# Patient Record
Sex: Male | Born: 1995
Health system: Southern US, Community
[De-identification: ages and names within clinical notes are randomized; demographics above are authoritative.]

## PROBLEM LIST (undated history)

## (undated) DIAGNOSIS — F32A Depression, unspecified: Secondary | ICD-10-CM

## (undated) DIAGNOSIS — F419 Anxiety disorder, unspecified: Secondary | ICD-10-CM

## (undated) DIAGNOSIS — F329 Major depressive disorder, single episode, unspecified: Secondary | ICD-10-CM

## (undated) HISTORY — DX: Anxiety disorder, unspecified: F41.9

## (undated) HISTORY — PX: WISDOM TOOTH EXTRACTION: SHX21

## (undated) HISTORY — DX: Major depressive disorder, single episode, unspecified: F32.9

## (undated) HISTORY — DX: Depression, unspecified: F32.A

---
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
## 2016-02-21 DIAGNOSIS — H52223 Regular astigmatism, bilateral: Secondary | ICD-10-CM | POA: Diagnosis not present

## 2016-02-21 DIAGNOSIS — H5213 Myopia, bilateral: Secondary | ICD-10-CM | POA: Diagnosis not present

## 2016-05-16 DIAGNOSIS — F332 Major depressive disorder, recurrent severe without psychotic features: Secondary | ICD-10-CM | POA: Diagnosis not present

## 2016-05-16 MED FILL — ESCITALOPRAM 10 MG TABLET: 10 | 30 days supply | Qty: 30 | Fill #0

## 2016-06-13 DIAGNOSIS — F332 Major depressive disorder, recurrent severe without psychotic features: Secondary | ICD-10-CM | POA: Diagnosis not present

## 2016-06-13 MED FILL — ESCITALOPRAM 20 MG TABLET: 20 | 30 days supply | Qty: 30 | Fill #0

## 2016-07-11 DIAGNOSIS — F332 Major depressive disorder, recurrent severe without psychotic features: Secondary | ICD-10-CM | POA: Diagnosis not present

## 2016-07-15 MED FILL — ESCITALOPRAM 20 MG TABLET: 20 | 30 days supply | Qty: 30 | Fill #0

## 2016-08-18 MED FILL — diazePAM 5 MG TABS: 5 | 1 days supply | Qty: 1 | Fill #0

## 2016-08-18 MED FILL — OXYCODONE/APAP 5/325 MG TAB: 5-325 | 5 days supply | Qty: 24 | Fill #0

## 2016-08-18 MED FILL — AMOXICILLIN 500 MG CAPSULE: 500 | 7 days supply | Qty: 21 | Fill #0

## 2016-08-18 MED FILL — METHYLPREDNISOLONE 4 MG TAB: 4 | 6 days supply | Qty: 21 | Fill #0

## 2016-08-18 MED FILL — IBUPROFEN 800 MG TABLET: 800 | 7 days supply | Qty: 21 | Fill #0

## 2016-08-18 MED FILL — ESCITALOPRAM 20 MG TABLET: 20 | 30 days supply | Qty: 30 | Fill #1

## 2016-09-15 MED FILL — ESCITALOPRAM 20 MG TABLET: 20 | 30 days supply | Qty: 30 | Fill #2

## 2016-10-03 DIAGNOSIS — F332 Major depressive disorder, recurrent severe without psychotic features: Secondary | ICD-10-CM | POA: Diagnosis not present

## 2016-10-15 MED FILL — ESCITALOPRAM 20 MG TABLET: 20 | 90 days supply | Qty: 90 | Fill #0

## 2016-12-26 MED FILL — ESCITALOPRAM 20 MG TABLET: 20 | 90 days supply | Qty: 90 | Fill #1

## 2017-01-23 DIAGNOSIS — F332 Major depressive disorder, recurrent severe without psychotic features: Secondary | ICD-10-CM | POA: Diagnosis not present

## 2017-04-08 MED FILL — ESCITALOPRAM 20 MG TABLET: 20 | 90 days supply | Qty: 90 | Fill #0

## 2017-07-13 MED FILL — ESCITALOPRAM 20 MG TABLET: 20 | 90 days supply | Qty: 90 | Fill #1

## 2017-07-21 DIAGNOSIS — F419 Anxiety disorder, unspecified: Secondary | ICD-10-CM | POA: Diagnosis not present

## 2017-07-21 DIAGNOSIS — F3342 Major depressive disorder, recurrent, in full remission: Secondary | ICD-10-CM | POA: Diagnosis not present

## 2017-09-28 DIAGNOSIS — H5213 Myopia, bilateral: Secondary | ICD-10-CM | POA: Diagnosis not present

## 2017-09-28 DIAGNOSIS — H52223 Regular astigmatism, bilateral: Secondary | ICD-10-CM | POA: Diagnosis not present

## 2017-10-13 DIAGNOSIS — F3342 Major depressive disorder, recurrent, in full remission: Secondary | ICD-10-CM | POA: Diagnosis not present

## 2017-10-13 MED FILL — ESCITALOPRAM 20 MG TABLET: 20 | 90 days supply | Qty: 90 | Fill #0

## 2017-12-07 ENCOUNTER — Ambulatory Visit: Payer: 59 | Admitting: Family Medicine

## 2017-12-07 ENCOUNTER — Encounter: Payer: Self-pay | Admitting: Family Medicine

## 2017-12-07 DIAGNOSIS — F411 Generalized anxiety disorder: Secondary | ICD-10-CM | POA: Diagnosis not present

## 2017-12-07 DIAGNOSIS — F339 Major depressive disorder, recurrent, unspecified: Secondary | ICD-10-CM

## 2017-12-07 NOTE — Progress Notes (Signed)
BP 110/71   Pulse 74   Temp 98.5 F (36.9 C) (Oral)   Ht 5\' 11"  (1.803 m)   Wt 176 lb (79.8 kg)   BMI 24.55 kg/m    Subjective:    Patient ID: Jacob Jennings, male    DOB: 1996/01/22, 22 y.o.   MRN: [id number]  HPI: Jacob Jennings is a 22 y.o. male presenting on 12/07/2017 for Establish Care   HPI Anxiety and depression Patient is coming in to establish care with our office and for anxiety depression.  He is [place] and is aging out and is coming up here to establish with us.  He has been on the Lexapro for about 5 months now and says that has been doing well for him he denies any major side effects from it.  He denies any suicidal ideations or thoughts of hurting himself. Depression screen PHQ 2/9 12/07/2017  Decreased Interest 1  Down, Depressed, Hopeless 1  PHQ - 2 Score 2  Altered sleeping 0  Tired, decreased energy 1  Change in appetite 0  Feeling bad or failure about yourself  1  Trouble concentrating 0  Moving slowly or fidgety/restless 0  Suicidal thoughts 0  PHQ-9 Score 4     Relevant past medical, surgical, family and social history reviewed and updated as indicated. Interim medical history since our last visit reviewed. Allergies and medications reviewed and updated.  Review of Systems  Constitutional: Negative for chills and fever.  HENT: Negative for ear pain and tinnitus.   Eyes: Negative for pain.  Respiratory: Negative for cough, shortness of breath and wheezing.   Cardiovascular: Negative for chest pain, palpitations and leg swelling.  Gastrointestinal: Negative for abdominal pain, blood in stool, constipation and diarrhea.  Genitourinary: Negative for dysuria and hematuria.  Musculoskeletal: Negative for back pain, gait problem and myalgias.  Skin: Negative for rash.  Neurological: Negative for dizziness, weakness and headaches.  Psychiatric/Behavioral: Positive for dysphoric mood. Negative for  suicidal ideas. The patient is nervous/anxious.   All other systems reviewed and are negative.   Per HPI unless specifically indicated above  Social History   Socioeconomic History  . Marital status: Single    Spouse name: Not on file  . Number of children: Not on file  . Years of education: Not on file  . Highest education level: Not on file  Social Needs  . Financial resource strain: Not on file  . Food insecurity - worry: Not on file  . Food insecurity - inability: Not on file  . Transportation needs - medical: Not on file  . Transportation needs - non-medical: Not on file  Occupational History  . Not on file  Tobacco Use  . Smoking status: Never Smoker  . Smokeless tobacco: Never Used  Substance and Sexual Activity  . Alcohol use: No    Frequency: Never  . Drug use: No  . Sexual activity: No    Birth control/protection: Abstinence  Other Topics Concern  . Not on file  Social History Narrative   Holiday representativeConstruction, with private contracting    Past Surgical History:  Procedure Laterality Date  . oral lesion biopsy  2016  . WISDOM TOOTH EXTRACTION      Family History  Problem Relation Age of Onset  . Diabetes Mother   . Arthritis Father   . Hearing loss Father   . Hypertension Father   . Drug abuse Brother   . Heart disease  Maternal Grandmother   . Early death Maternal Grandfather 44  . Heart disease Maternal Grandfather   . Diabetes Maternal Grandfather   . Cancer Paternal Grandmother        thyroid    Allergies as of 12/07/2017   No Known Allergies     Medication List        Accurate as of 12/07/17 10:15 AM. Always use your most recent med list.          escitalopram 20 MG tablet Commonly known as:  LEXAPRO Take 20 mg by mouth at bedtime.          Objective:    BP 110/71   Pulse 74   Temp 98.5 F (36.9 C) (Oral)   Ht 5\' 11"  (1.803 m)   Wt 176 lb (79.8 kg)   BMI 24.55 kg/m   Wt Readings from Last 3 Encounters:  12/07/17 176 lb (79.8  kg)    Physical Exam  Constitutional: He is oriented to person, place, and time. He appears well-developed and well-nourished. No distress.  Eyes: Conjunctivae are normal. No scleral icterus.  Neck: Neck supple. No thyromegaly present.  Cardiovascular: Normal rate, regular rhythm, normal heart sounds and intact distal pulses.  No murmur heard. Pulmonary/Chest: Effort normal and breath sounds normal. No respiratory distress. He has no wheezes.  Musculoskeletal: Normal range of motion. He exhibits no edema.  Lymphadenopathy:    He has no cervical adenopathy.  Neurological: He is alert and oriented to person, place, and time. Coordination normal.  Skin: Skin is warm and dry. No rash noted. He is not diaphoretic.  Psychiatric: His behavior is normal. His mood appears anxious. He exhibits a depressed mood. He expresses no suicidal ideation. He expresses no suicidal plans.  Nursing note and vitals reviewed.  No results found for this or any previous visit.    Assessment & Plan:   Problem List Items Addressed This Visit      Other   Depression, recurrent (HCC)   Relevant Medications   escitalopram (LEXAPRO) 20 MG tablet   GAD (generalized anxiety disorder)   Relevant Medications   escitalopram (LEXAPRO) 20 MG tablet     continue lexapro   Follow up plan: Return in about 3 months (around 03/23/2018), or if symptoms worsen or fail to improve, for depression and anxiety.  Arville Care, MD [facility] 12/07/2017, 10:15 AM

## 2018-01-18 MED FILL — ESCITALOPRAM 20 MG TABLET: 20 | 90 days supply | Qty: 90 | Fill #0

## 2018-03-12 ENCOUNTER — Encounter: Payer: Self-pay | Admitting: Family Medicine

## 2018-03-12 ENCOUNTER — Ambulatory Visit (INDEPENDENT_AMBULATORY_CARE_PROVIDER_SITE_OTHER): Payer: 59 | Admitting: Family Medicine

## 2018-03-12 VITALS — BP 132/69 | HR 78 | Temp 98.9°F | Ht 71.0 in | Wt 179.0 lb

## 2018-03-12 DIAGNOSIS — F339 Major depressive disorder, recurrent, unspecified: Secondary | ICD-10-CM | POA: Diagnosis not present

## 2018-03-12 DIAGNOSIS — F411 Generalized anxiety disorder: Secondary | ICD-10-CM

## 2018-03-12 NOTE — Progress Notes (Signed)
BP 132/69   Pulse 78   Temp 98.9 F (37.2 C) (Oral)   Ht 5\' 11"  (1.803 m)   Wt 179 lb (81.2 kg)   BMI 24.97 kg/m    Subjective:    Patient ID: Jacob Jennings, male    DOB: 12/13/1995, 22 y.o.   MRN: [id number]  HPI: Jacob Jennings is a 22 y.o. male presenting on 03/12/2018 for Depression/Anxiety (3 month follow up)   HPI Depression and anxiety recheck Patient is coming in for depression and anxiety recheck.  He says he feels like Lexapro still doing good form denies any major issues.  He says he still has some trouble with social situation and anxiety surrounding that but denies any other major issues.  He denies any suicidal ideations or thoughts of hurting himself.  He is not openly very talkative today but reiterates the fact that he is doing well. Depression screen [facility] 2/9 03/12/2018 12/07/2017  Decreased Interest 0 1  Down, Depressed, Hopeless 0 1  PHQ - 2 Score 0 2  Altered sleeping - 0  Tired, decreased energy - 1  Change in appetite - 0  Feeling bad or failure about yourself  - 1  Trouble concentrating - 0  Moving slowly or fidgety/restless - 0  Suicidal thoughts - 0  PHQ-9 Score - 4     Relevant past medical, surgical, family and social history reviewed and updated as indicated. Interim medical history since our last visit reviewed. Allergies and medications reviewed and updated.  Review of Systems  Constitutional: Negative for chills and fever.  Respiratory: Negative for shortness of breath and wheezing.   Cardiovascular: Negative for chest pain and leg swelling.  Musculoskeletal: Negative for back pain and gait problem.  Skin: Negative for rash.  Psychiatric/Behavioral: Positive for decreased concentration and dysphoric mood. Negative for self-injury, sleep disturbance and suicidal ideas. The patient is nervous/anxious.   All other systems reviewed and are negative.   Per HPI unless specifically indicated above   Allergies as of 03/12/2018   No Known  Allergies     Medication List        Accurate as of 03/12/18  2:35 PM. Always use your most recent med list.          escitalopram 20 MG tablet Commonly known as:  LEXAPRO Take 20 mg by mouth at bedtime.          Objective:    BP 132/69   Pulse 78   Temp 98.9 F (37.2 C) (Oral)   Ht 5\' 11"  (1.803 m)   Wt 179 lb (81.2 kg)   BMI 24.97 kg/m   Wt Readings from Last 3 Encounters:  03/12/18 179 lb (81.2 kg)  12/07/17 176 lb (79.8 kg)    Physical Exam  Constitutional: He is oriented to person, place, and time. He appears well-developed and well-nourished. No distress.  Eyes: Conjunctivae are normal. No scleral icterus.  Neck: Neck supple. No thyromegaly present.  Cardiovascular: Normal rate, regular rhythm, normal heart sounds and intact distal pulses.  No murmur heard. Pulmonary/Chest: Effort normal and breath sounds normal. No respiratory distress. He has no wheezes.  Lymphadenopathy:    He has no cervical adenopathy.  Neurological: He is alert and oriented to person, place, and time. Coordination normal.  Skin: Skin is warm and dry. No rash noted. He is not diaphoretic.  Psychiatric: He has a normal mood and affect. His behavior is normal.  Nursing note and vitals reviewed.  No results found for this or any previous visit.    Assessment & Plan:   Problem List Items Addressed This Visit      Other   Depression, recurrent (HCC) - Primary   GAD (generalized anxiety disorder)    Continue Lexapro, will see back in 6 months  Follow up plan: Return in about 6 months (around 09/11/2018), or if symptoms worsen or fail to improve, for Depression and anxiety.  Counseling provided for all of the vaccine components No orders of the defined types were placed in this encounter.   Arville CareJoshua Chalene Treu, MD [facility] 03/12/2018, 2:35 PM

## 2018-03-18 ENCOUNTER — Ambulatory Visit: Payer: 59 | Admitting: Pediatrics

## 2018-03-18 ENCOUNTER — Encounter: Payer: Self-pay | Admitting: Pediatrics

## 2018-03-18 VITALS — BP 114/77 | HR 87 | Temp 98.7°F | Ht 71.0 in | Wt 176.8 lb

## 2018-03-18 DIAGNOSIS — H8113 Benign paroxysmal vertigo, bilateral: Secondary | ICD-10-CM | POA: Diagnosis not present

## 2018-03-18 MED ORDER — MECLIZINE HCL 12.5 MG PO TABS: 12.5000 mg | ORAL_TABLET | Freq: Three times a day (TID) | ORAL | 0 refills | Status: DC | PRN

## 2018-03-18 MED ORDER — ONDANSETRON 4 MG PO TBDP: 4.0000 mg | ORAL_TABLET | Freq: Three times a day (TID) | ORAL | 0 refills | Status: DC | PRN

## 2018-03-18 NOTE — Progress Notes (Signed)
  Subjective:   Patient ID: Jacob Jennings, male    DOB: March 21, 1996, 22 y.o.   MRN: [id number] CC: Nausea (With movement)  HPI: Jacob Jennings is a 22 y.o. male  Here today with mom. 5 days ago he was on a swing in competition with a friend trying to go as high as they could.  Mom says he almost went over the top of the play set.  He says after he stood up following that, he had some dizziness and nausea.  He is continued to feel somewhat nauseous with movements of his head since then.  Driving bothers him, turning his head quickly bothers him.  Bending over and standing back up bothersome.  He feels fine as long as he is not moving.  He does not see the room spinning.  He slept with a trash can next to his bed because he was worried he was going to throw up.  Relevant past medical, surgical, family and social history reviewed. Allergies and medications reviewed and updated. Social History   Tobacco Use  Smoking Status Never Smoker  Smokeless Tobacco Never Used   ROS: Per HPI   Objective:    BP 114/77   Pulse 87   Temp 98.7 F (37.1 C) (Oral)   Ht 5\' 11"  (1.803 m)   Wt 176 lb 12.8 oz (80.2 kg)   BMI 24.66 kg/m   Wt Readings from Last 3 Encounters:  03/18/18 176 lb 12.8 oz (80.2 kg)  03/12/18 179 lb (81.2 kg)  12/07/17 176 lb (79.8 kg)    Gen: NAD, alert, cooperative with exam, NCAT EYES: EOMI, couple beats of nystagmus present bilaterally with lateral eye movements.  No conjunctival injection, or no icterus ENT:  TMs pearly gray b/l, OP without erythema LYMPH: no cervical LAD CV: NRRR, normal S1/S2, no murmur, distal pulses 2+ b/l Resp: CTABL, no wheezes, normal WOB Abd: +BS, soft, NTND. Ext: No edema, warm Neuro: Alert and oriented, strength equal b/l UE and LE, sensation equal bilateral extremities, rapid alternating movements intact, CN-III-XII intact, coordination grossly normal  Assessment & Plan:  Jacob Jennings was seen today for nausea.  Diagnoses and all orders for  this visit:  Benign paroxysmal positional vertigo due to bilateral vestibular disorder Discussed Epley maneuver, gave information how to perform at home.  Any worsening or lack of improvement let us know. -     meclizine (ANTIVERT) 12.5 MG tablet; Take 1 tablet (12.5 mg total) by mouth 3 (three) times daily as needed for dizziness. -     ondansetron (ZOFRAN-ODT) 4 MG disintegrating tablet; Take 1 tablet (4 mg total) by mouth every 8 (eight) hours as needed for nausea or vomiting.   Follow up plan: As needed Rex Krasarol Vincent, MD [facility]

## 2018-03-18 NOTE — Patient Instructions (Signed)

## 2018-04-26 MED FILL — ESCITALOPRAM 20 MG TABLET: 20 | 90 days supply | Qty: 90 | Fill #1

## 2018-06-17 DIAGNOSIS — F3342 Major depressive disorder, recurrent, in full remission: Secondary | ICD-10-CM | POA: Diagnosis not present

## 2018-07-29 MED FILL — ESCITALOPRAM 20 MG TABLET: 20 | 90 days supply | Qty: 90 | Fill #0

## 2018-09-13 ENCOUNTER — Ambulatory Visit: Payer: 59 | Admitting: Family Medicine

## 2018-09-13 ENCOUNTER — Encounter: Payer: Self-pay | Admitting: Family Medicine

## 2018-09-13 VITALS — BP 118/75 | HR 72 | Temp 98.2°F | Ht 71.0 in | Wt 188.4 lb

## 2018-09-13 DIAGNOSIS — F411 Generalized anxiety disorder: Secondary | ICD-10-CM | POA: Diagnosis not present

## 2018-09-13 DIAGNOSIS — F339 Major depressive disorder, recurrent, unspecified: Secondary | ICD-10-CM

## 2018-09-13 MED ORDER — ESCITALOPRAM OXALATE 20 MG PO TABS: 20.0000 mg | ORAL_TABLET | Freq: Every day | ORAL | 3 refills | Status: DC

## 2018-09-13 NOTE — Progress Notes (Signed)
BP 118/75   Pulse 72   Temp 98.2 F (36.8 C) (Oral)   Ht 5\' 11"  (1.803 m)   Wt 188 lb 6.4 oz (85.5 kg)   BMI 26.28 kg/m    Subjective:    Patient ID: Jacob Jennings, male    DOB: 1996/07/21, 22 y.o.   MRN: [id number]  HPI: Jacob Jennings is a 22 y.o. male presenting on 09/13/2018 for Depression (6 month follow up)   HPI Depression and anxiety recheck Patient is coming in today for depression anxiety recheck.  He has been on the Lexapro and feels like it is doing very well for him.  He says that he is very content with the medication denies any side effects from it.  He has been very happy with life and feels like it is going very well right now. [facility] 2/9 09/13/2018 03/18/2018 03/12/2018 12/07/2017  Decreased Interest 0 0 0 1  Down, Depressed, Hopeless 0 0 0 1  PHQ - 2 Score 0 0 0 2  Altered sleeping - - - 0  Tired, decreased energy - - - 1  Change in appetite - - - 0  Feeling bad or failure about yourself  - - - 1  Trouble concentrating - - - 0  Moving slowly or fidgety/restless - - - 0  Suicidal thoughts - - - 0  PHQ-9 Score - - - 4     Relevant past medical, surgical, family and social history reviewed and updated as indicated. Interim medical history since our last visit reviewed. Allergies and medications reviewed and updated.  Review of Systems  Constitutional: Negative for chills and fever.  Eyes: Negative for visual disturbance.  Respiratory: Negative for shortness of breath and wheezing.   Cardiovascular: Negative for chest pain and leg swelling.  Musculoskeletal: Negative for back pain and gait problem.  Skin: Negative for rash.  Neurological: Negative for dizziness, weakness, light-headedness and numbness.  Psychiatric/Behavioral: Positive for dysphoric mood. Negative for self-injury, sleep disturbance and suicidal ideas. The patient is nervous/anxious.   All other systems reviewed and are negative.   Per HPI unless specifically indicated  above   Allergies as of 09/13/2018   No Known Allergies     Medication List       Accurate as of September 13, 2018  2:21 PM. Always use your most recent med list.        escitalopram 20 MG tablet Commonly known as:  LEXAPRO Take 1 tablet (20 mg total) by mouth at bedtime.          Objective:    BP 118/75   Pulse 72   Temp 98.2 F (36.8 C) (Oral)   Ht 5\' 11"  (1.803 m)   Wt 188 lb 6.4 oz (85.5 kg)   BMI 26.28 kg/m   Wt Readings from Last 3 Encounters:  09/13/18 188 lb 6.4 oz (85.5 kg)  03/18/18 176 lb 12.8 oz (80.2 kg)  03/12/18 179 lb (81.2 kg)    Physical Exam Vitals signs and nursing note reviewed.  Constitutional:      General: He is not in acute distress.    Appearance: He is well-developed. He is not diaphoretic.  Eyes:     General: No scleral icterus.    Conjunctiva/sclera: Conjunctivae normal.  Neck:     Musculoskeletal: Neck supple.     Thyroid: No thyromegaly.  Cardiovascular:     Rate and Rhythm: Normal rate and regular rhythm.     Heart  sounds: Normal heart sounds. No murmur.  Pulmonary:     Effort: Pulmonary effort is normal. No respiratory distress.     Breath sounds: Normal breath sounds. No wheezing.  Musculoskeletal: Normal range of motion.  Lymphadenopathy:     Cervical: No cervical adenopathy.  Skin:    General: Skin is warm and dry.     Findings: No rash.  Neurological:     Mental Status: He is alert and oriented to person, place, and time.     Coordination: Coordination normal.  Psychiatric:        Mood and Affect: Mood is anxious and depressed.        Behavior: Behavior normal.        Thought Content: Thought content does not include suicidal ideation. Thought content does not include suicidal plan.     No results found for this or any previous visit.    Assessment & Plan:   Problem List Items Addressed This Visit      Other   Depression, recurrent (HCC) - Primary   Relevant Medications   escitalopram (LEXAPRO) 20 MG  tablet   GAD (generalized anxiety disorder)   Relevant Medications   escitalopram (LEXAPRO) 20 MG tablet       Follow up plan: Return in about 6 months (around 03/15/2019), or if symptoms worsen or fail to improve, for Anxiety recheck.  Counseling provided for all of the vaccine components No orders of the defined types were placed in this encounter.   Arville CareJoshua Kynslee Baham, MD [facility] 09/13/2018, 2:21 PM

## 2018-10-26 MED FILL — ESCITALOPRAM 20 MG TABLET: 20 | 90 days supply | Qty: 90 | Fill #1

## 2019-01-27 ENCOUNTER — Encounter: Payer: Self-pay | Admitting: Psychiatry

## 2019-01-27 ENCOUNTER — Other Ambulatory Visit: Payer: Self-pay

## 2019-01-27 ENCOUNTER — Ambulatory Visit (INDEPENDENT_AMBULATORY_CARE_PROVIDER_SITE_OTHER): Payer: 59 | Admitting: Psychiatry

## 2019-01-27 DIAGNOSIS — F411 Generalized anxiety disorder: Secondary | ICD-10-CM

## 2019-01-27 DIAGNOSIS — F3342 Major depressive disorder, recurrent, in full remission: Secondary | ICD-10-CM | POA: Diagnosis not present

## 2019-01-27 MED ORDER — ESCITALOPRAM OXALATE 20 MG PO TABS: 20.0000 mg | ORAL_TABLET | Freq: Every day | ORAL | 1 refills | Status: DC

## 2019-01-27 MED FILL — ESCITALOPRAM 20 MG TABLET: 20 | 90 days supply | Qty: 90 | Fill #2

## 2019-01-27 NOTE — Progress Notes (Signed)
Jacob Jennings [id number] 1996/07/17 23 y.o.  Virtual Visit via Telephone Note  I connected with@ on 01/27/19 at  9:30 AM EDT by telephone and verified that I am speaking with the correct person using two identifiers.   I discussed the limitations, risks, security and privacy concerns of performing an evaluation and management service by telephone and the availability of in person appointments. I also discussed with the patient that there may be a patient responsible charge related to this service. The patient expressed understanding and agreed to proceed.   I discussed the assessment and treatment plan with the patient. The patient was provided an opportunity to ask questions and all were answered. The patient agreed with the plan and demonstrated an understanding of the instructions.   The patient was advised to call back or seek an in-person evaluation if the symptoms worsen or if the condition fails to improve as anticipated.  I provided 30 minutes of non-face-to-face time during this encounter.  The patient was located at home.  The provider was located at home.   Jacob Jennings, PMHNP   Subjective:   Patient ID:  Jacob Jennings is a 23 y.o. (DOB May 09, 1996) male.  Chief Complaint:  Chief Complaint  Patient presents with  . Follow-up    h/o anxiety and depression    HPI Jacob Jennings presents for follow-up of depression and anxiety. He reports that his mood has been stable. Denies anxiety to include worry or social anxiety. He reports that he has been sleeping well. App has been stable. Denies difficulty with energy and motivation. Concentration has been adequate. Denies SI.    Review of Systems:  Review of Systems  Gastrointestinal: Negative.   Musculoskeletal: Negative for gait problem.  Neurological: Negative for tremors.  Psychiatric/Behavioral:       Please refer to HPI    Medications: I have reviewed the patient's current medications.  Current Outpatient  Medications  Medication Sig Dispense Refill  . escitalopram (LEXAPRO) 20 MG tablet Take 1 tablet (20 mg total) by mouth at bedtime. 90 tablet 1   No current facility-administered medications for this visit.     Medication Side Effects: None  Allergies: No Known Allergies  Past Medical History:  Diagnosis Date  . Anxiety   . Depression     Family History  Problem Relation Age of Onset  . Diabetes Mother   . Arthritis Father   . Hearing loss Father   . Hypertension Father   . Drug abuse Brother   . Heart disease Maternal Grandmother   . Early death Maternal Grandfather 16  . Heart disease Maternal Grandfather   . Diabetes Maternal Grandfather   . Cancer Paternal Grandmother        thyroid    Social History   Socioeconomic History  . Marital status: Single    Spouse name: Not on file  . Number of children: Not on file  . Years of education: Not on file  . Highest education level: Not on file  Occupational History  . Not on file  Social Needs  . Financial resource strain: Not on file  . Food insecurity:    Worry: Not on file    Inability: Not on file  . Transportation needs:    Medical: Not on file    Non-medical: Not on file  Tobacco Use  . Smoking status: Never Smoker  . Smokeless tobacco: Never Used  Substance and Sexual Activity  . Alcohol use: No  Frequency: Never  . Drug use: No  . Sexual activity: Never    Birth control/protection: Abstinence  Lifestyle  . Physical activity:    Days per week: Not on file    Minutes per session: Not on file  . Stress: Not on file  Relationships  . Social connections:    Talks on phone: Not on file    Gets together: Not on file    Attends religious service: Not on file    Active member of club or organization: Not on file    Attends meetings of clubs or organizations: Not on file    Relationship status: Not on file  . Intimate partner violence:    Fear of current or ex partner: Not on file    Emotionally  abused: Not on file    Physically abused: Not on file    Forced sexual activity: Not on file  Other Topics Concern  . Not on file  Social History Narrative   Holiday representativeConstruction, with private contracting    Past Medical History, Surgical history, Social history, and Family history were reviewed and updated as appropriate.   Please see review of systems for further details on the patient's review from today.   Objective:   Physical Exam:  There were no vitals taken for this visit.  Physical Exam Neurological:     Mental Status: He is alert and oriented to person, place, and time.     Cranial Nerves: No dysarthria.  Psychiatric:        Attention and Perception: Attention normal.        Mood and Affect: Mood normal.        Speech: Speech normal.        Behavior: Behavior is cooperative.        Thought Content: Thought content normal. Thought content is not paranoid or delusional. Thought content does not include homicidal or suicidal ideation. Thought content does not include homicidal or suicidal plan.        Cognition and Memory: Cognition and memory normal.        Judgment: Judgment normal.     Lab Review:  No results found for: NA, K, CL, CO2, GLUCOSE, BUN, CREATININE, CALCIUM, PROT, ALBUMIN, AST, ALT, ALKPHOS, BILITOT, GFRNONAA, GFRAA  No results found for: WBC, RBC, HGB, HCT, PLT, MCV, MCH, MCHC, RDW, LYMPHSABS, MONOABS, EOSABS, BASOSABS  No results found for: POCLITH, LITHIUM   No results found for: PHENYTOIN, PHENOBARB, VALPROATE, CBMZ   .res Assessment: Plan:   Patient reports that he would like to continue Lexapro 20 mg daily since his mood and anxiety signs and symptoms remain well controlled.  Discussed that it appears that PCP refilled Lexapro visit and that patient option of positioning management of depression and anxiety to PCP and following up in the office as needed or continuing follow-up in this office every 9 months to a year and sooner if indicated.  Patient  reports that he would like to continue to be followed yearly in this office management of anxiety and depression.  He agrees to contact office if he experiences any worsening in [place] or depression. Will continue Lexapro 20 mg daily for anxiety and depression.   GAD (generalized anxiety disorder) - Plan: escitalopram (LEXAPRO) 20 MG tablet  Recurrent major depressive disorder, in full remission (HCC)  Please see After Visit Summary for patient specific instructions.  Jacob Appointments  Date Time Provider Department Center  03/15/2019  3:25 PM Dettinger, Elige RadonJoshua A, MD [facility] None  No orders of the defined types were placed in this encounter.     -------------------------------

## 2019-03-11 ENCOUNTER — Other Ambulatory Visit: Payer: Self-pay

## 2019-03-15 ENCOUNTER — Encounter: Payer: Self-pay | Admitting: Family Medicine

## 2019-03-15 ENCOUNTER — Other Ambulatory Visit: Payer: Self-pay

## 2019-03-15 ENCOUNTER — Ambulatory Visit: Payer: 59 | Admitting: Family Medicine

## 2019-03-15 VITALS — BP 131/81 | HR 85 | Temp 98.9°F | Ht 71.0 in | Wt 193.0 lb

## 2019-03-15 DIAGNOSIS — F339 Major depressive disorder, recurrent, unspecified: Secondary | ICD-10-CM

## 2019-03-15 DIAGNOSIS — F411 Generalized anxiety disorder: Secondary | ICD-10-CM

## 2019-03-15 NOTE — Progress Notes (Signed)
BP 131/81   Pulse 85   Temp 98.9 F (37.2 C) (Oral)   Ht 5\' 11"  (1.803 m)   Wt 193 lb (87.5 kg)   BMI 26.92 kg/m    Subjective:   Patient ID: Jacob Jennings, male    DOB: [birth year], 23 y.o.   MRN: [id number]  HPI: Jacob Jennings is a 23 y.o. male presenting on 03/15/2019 for Depression (6 month follow up)   HPI Depression and anxiety recheck Patient is coming in today for depression anxiety recheck.  He says that he is doing very well on his medication and despite that it coronavirus he is not having any issues and says his anxiety is under good control and denies any major depressions or thoughts of hurting himself.  He is keeping busy with work. [facility] 2/9 03/15/2019 09/13/2018 03/18/2018 03/12/2018 12/07/2017  Decreased Interest 0 0 0 0 1  Down, Depressed, Hopeless 0 0 0 0 1  PHQ - 2 Score 0 0 0 0 2  Altered sleeping - - - - 0  Tired, decreased energy - - - - 1  Change in appetite - - - - 0  Feeling bad or failure about yourself  - - - - 1  Trouble concentrating - - - - 0  Moving slowly or fidgety/restless - - - - 0  Suicidal thoughts - - - - 0  PHQ-9 Score - - - - 4     Relevant past medical, surgical, family and social history reviewed and updated as indicated. Interim medical history since our last visit reviewed. Allergies and medications reviewed and updated.  Review of Systems  Constitutional: Negative for chills and fever.  Respiratory: Negative for shortness of breath and wheezing.   Cardiovascular: Negative for chest pain and leg swelling.  Musculoskeletal: Negative for back pain and gait problem.  Skin: Negative for rash.  Neurological: Negative for dizziness, weakness and light-headedness.  All other systems reviewed and are negative.   Per HPI unless specifically indicated above   Objective:   BP 131/81   Pulse 85   Temp 98.9 F (37.2 C) (Oral)   Ht 5\' 11"  (1.803 m)   Wt 193 lb (87.5 kg)   BMI 26.92 kg/m   Wt Readings from  Last 3 Encounters:  03/15/19 193 lb (87.5 kg)  09/13/18 188 lb 6.4 oz (85.5 kg)  03/18/18 176 lb 12.8 oz (80.2 kg)    Physical Exam Vitals signs and nursing note reviewed.  Constitutional:      General: He is not in acute distress.    Appearance: He is well-developed. He is not diaphoretic.  Eyes:     General: No scleral icterus.    Conjunctiva/sclera: Conjunctivae normal.  Neck:     Thyroid: No thyromegaly.  Cardiovascular:     Rate and Rhythm: Normal rate and regular rhythm.     Heart sounds: Normal heart sounds. No murmur.  Pulmonary:     Effort: Pulmonary effort is normal. No respiratory distress.     Breath sounds: Normal breath sounds. No wheezing.  Musculoskeletal: Normal range of motion.  Skin:    General: Skin is warm and dry.     Findings: No rash.  Neurological:     Mental Status: He is alert and oriented to person, place, and time.     Coordination: Coordination normal.  Psychiatric:        Behavior: Behavior normal.     No results found for this or any  previous visit.  Assessment & Plan:   Problem List Items Addressed This Visit      Other   Depression, recurrent ([place]) - Primary   GAD (generalized anxiety disorder)      Continue Lexapro, patient is doing well on it and says that it is helping him through these times. Follow up plan: Return in about 6 months (around 09/14/2019), or if symptoms worsen or fail to improve, for Adult well exam.  Counseling provided for all of the vaccine components No orders of the defined types were placed in this encounter.   Caryl Pina, MD [facility] 03/15/2019, 3:30 PM

## 2019-05-09 ENCOUNTER — Other Ambulatory Visit: Payer: Self-pay

## 2019-05-09 DIAGNOSIS — F411 Generalized anxiety disorder: Secondary | ICD-10-CM

## 2019-05-09 MED ORDER — ESCITALOPRAM OXALATE 20 MG PO TABS: 20.0000 mg | ORAL_TABLET | Freq: Every day | ORAL | 1 refills | Status: DC

## 2019-05-09 MED FILL — ESCITALOPRAM 20 MG TABLET: 20 | 90 days supply | Qty: 90 | Fill #0

## 2019-05-09 NOTE — Telephone Encounter (Signed)
Pharmacy changed from [facility] to [facility]

## 2019-08-11 MED FILL — ESCITALOPRAM 20 MG TABLET: 20 | 90 days supply | Qty: 90 | Fill #1

## 2019-09-13 ENCOUNTER — Other Ambulatory Visit: Payer: Self-pay

## 2019-09-14 ENCOUNTER — Ambulatory Visit: Payer: 59 | Admitting: Family Medicine

## 2019-09-14 ENCOUNTER — Encounter: Payer: Self-pay | Admitting: Family Medicine

## 2019-09-14 VITALS — BP 121/74 | HR 89 | Temp 98.9°F | Ht 71.0 in | Wt 197.6 lb

## 2019-09-14 DIAGNOSIS — F411 Generalized anxiety disorder: Secondary | ICD-10-CM

## 2019-09-14 DIAGNOSIS — F339 Major depressive disorder, recurrent, unspecified: Secondary | ICD-10-CM

## 2019-09-14 MED ORDER — ESCITALOPRAM OXALATE 20 MG PO TABS: 20.0000 mg | ORAL_TABLET | Freq: Every day | ORAL | 3 refills | Status: DC

## 2019-09-14 NOTE — Progress Notes (Signed)
BP 121/74   Pulse 89   Temp 98.9 F (37.2 C) (Temporal)   Ht 5\' 11"  (1.803 m)   Wt 197 lb 9.6 oz (89.6 kg)   SpO2 94%   BMI 27.56 kg/m    Subjective:   Patient ID: Jacob Jennings, male    DOB: 05-08-1996, 23 y.o.   MRN: [id number]  HPI: GARRETH BURNSWORTH is a 23 y.o. male presenting on 09/14/2019 for Depression (6 month follow up)   HPI Depression and anxiety Patient is coming in today for recheck of depression.  He says he is doing very well with the depression and denies any major issues with it.  He has been on the medication and stable for quite some time and is very satisfied with it wants to continue it.  He denies any major suicide ideations or major feelings of depression or major feelings of anxiety and says he has been doing really well even despite all of the coronavirus stuff that is been going on in the world. Depression screen [facility] 2/9 09/14/2019 03/15/2019 09/13/2018 03/18/2018 03/12/2018  Decreased Interest 0 0 0 0 0  Down, Depressed, Hopeless 0 0 0 0 0  PHQ - 2 Score 0 0 0 0 0  Altered sleeping - - - - -  Tired, decreased energy - - - - -  Change in appetite - - - - -  Feeling bad or failure about yourself  - - - - -  Trouble concentrating - - - - -  Moving slowly or fidgety/restless - - - - -  Suicidal thoughts - - - - -  PHQ-9 Score - - - - -     Relevant past medical, surgical, family and social history reviewed and updated as indicated. Interim medical history since our last visit reviewed. Allergies and medications reviewed and updated.  Review of Systems  Constitutional: Negative for chills and fever.  Respiratory: Negative for shortness of breath and wheezing.   Cardiovascular: Negative for chest pain and leg swelling.  Musculoskeletal: Negative for back pain and gait problem.  Skin: Negative for rash.  Psychiatric/Behavioral: Negative for decreased concentration, dysphoric mood, self-injury, sleep disturbance and suicidal ideas. The patient is not  nervous/anxious.   All other systems reviewed and are negative.   Per HPI unless specifically indicated above   Allergies as of 09/14/2019   No Known Allergies     Medication List       Accurate as of September 14, 2019  3:46 PM. If you have any questions, ask your nurse or doctor.        escitalopram 20 MG tablet Commonly known as: LEXAPRO Take 1 tablet (20 mg total) by mouth at bedtime.        Objective:   BP 121/74   Pulse 89   Temp 98.9 F (37.2 C) (Temporal)   Ht 5\' 11"  (1.803 m)   Wt 197 lb 9.6 oz (89.6 kg)   SpO2 94%   BMI 27.56 kg/m   Wt Readings from Last 3 Encounters:  09/14/19 197 lb 9.6 oz (89.6 kg)  03/15/19 193 lb (87.5 kg)  09/13/18 188 lb 6.4 oz (85.5 kg)    Physical Exam Vitals and nursing note reviewed.  Constitutional:      General: He is not in acute distress.    Appearance: He is well-developed. He is not diaphoretic.  Eyes:     General: No scleral icterus.       Right eye: No discharge.  Conjunctiva/sclera: Conjunctivae normal.     Pupils: Pupils are equal, round, and reactive to light.  Neck:     Thyroid: No thyromegaly.  Cardiovascular:     Rate and Rhythm: Normal rate and regular rhythm.     Heart sounds: Normal heart sounds. No murmur.  Pulmonary:     Effort: Pulmonary effort is normal. No respiratory distress.     Breath sounds: Normal breath sounds. No wheezing.  Musculoskeletal:        General: Normal range of motion.     Cervical back: Neck supple.  Lymphadenopathy:     Cervical: No cervical adenopathy.  Skin:    General: Skin is warm and dry.     Findings: No rash.  Neurological:     Mental Status: He is alert and oriented to person, place, and time.     Coordination: Coordination normal.  Psychiatric:        Behavior: Behavior normal.     No results found for this or any previous visit.  Assessment & Plan:   Problem List Items Addressed This Visit      Other   Depression, recurrent ([facility]) - Primary    Relevant Medications   escitalopram (LEXAPRO) 20 MG tablet   GAD (generalized anxiety disorder)   Relevant Medications   escitalopram (LEXAPRO) 20 MG tablet    Patient doing well continue Lexapro  Follow up plan: Return in about 6 months (around 03/14/2020), or if symptoms worsen or fail to improve, for Physical exam and depression.  Counseling provided for all of the vaccine components No orders of the defined types were placed in this encounter.   Arville Care, MD [facility] 09/14/2019, 3:46 PM

## 2019-12-15 DIAGNOSIS — H5213 Myopia, bilateral: Secondary | ICD-10-CM | POA: Diagnosis not present

## 2019-12-15 DIAGNOSIS — H52223 Regular astigmatism, bilateral: Secondary | ICD-10-CM | POA: Diagnosis not present

## 2020-03-14 ENCOUNTER — Encounter: Payer: 59 | Admitting: Family Medicine

## 2020-03-16 ENCOUNTER — Ambulatory Visit (INDEPENDENT_AMBULATORY_CARE_PROVIDER_SITE_OTHER): Payer: 59 | Admitting: Physician Assistant

## 2020-03-16 ENCOUNTER — Encounter: Payer: Self-pay | Admitting: Physician Assistant

## 2020-03-16 ENCOUNTER — Other Ambulatory Visit: Payer: Self-pay

## 2020-03-16 VITALS — BP 114/74 | HR 80 | Temp 98.2°F | Ht 71.0 in | Wt 203.6 lb

## 2020-03-16 DIAGNOSIS — Z0001 Encounter for general adult medical examination with abnormal findings: Secondary | ICD-10-CM

## 2020-03-16 DIAGNOSIS — Z0000 Encounter for general adult medical examination without abnormal findings: Secondary | ICD-10-CM

## 2020-03-16 DIAGNOSIS — F411 Generalized anxiety disorder: Secondary | ICD-10-CM | POA: Diagnosis not present

## 2020-03-16 NOTE — Patient Instructions (Signed)

## 2020-03-16 NOTE — Progress Notes (Signed)
Subjective:     Patient ID: Jacob Jennings, male   DOB: [birth year], 24 y.o.   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   HENT: Negative.   Eyes: Positive for visual disturbance.       Wears glasses  Respiratory: Negative.   Cardiovascular: Negative.   Gastrointestinal: Negative.   Endocrine: Negative.   Genitourinary: Negative.   Musculoskeletal: Negative.   Skin: Negative.   Allergic/Immunologic: Negative.   Neurological: Negative.   Psychiatric/Behavioral: Negative.        Hx of anxiety/depression but no sx since on med       Objective:   Physical Exam Vitals and nursing note reviewed.  Constitutional:      General: He is not in acute distress.    Appearance: Normal appearance. He is normal weight. He is not ill-appearing, toxic-appearing or diaphoretic.  HENT:     Head: Normocephalic and atraumatic.     Right Ear: Tympanic membrane, ear canal and external ear normal.     Left Ear: Tympanic membrane, ear canal and external ear normal.     Mouth/Throat:     Mouth: Mucous membranes are moist.     Pharynx: Oropharynx is clear. No oropharyngeal exudate or posterior oropharyngeal erythema.  Eyes:     Extraocular Movements: Extraocular movements intact.     Conjunctiva/sclera: Conjunctivae normal.     Pupils: Pupils are equal, round, and reactive to light.     Comments: Wearing glasses  Cardiovascular:     Rate and Rhythm: Normal rate and regular rhythm.     Pulses: Normal pulses.     Heart sounds: Normal heart sounds.  Pulmonary:     Effort: Pulmonary effort is normal.     Breath sounds: Normal breath sounds.  Abdominal:     General: Abdomen is flat. There is no distension.     Palpations: There is no  mass.     Tenderness: There is no abdominal tenderness. There is no right CVA tenderness, left CVA tenderness, guarding or rebound.     Hernia: No hernia is present.  Musculoskeletal:        General: No swelling, tenderness, deformity or signs of injury. Normal range of motion.     Cervical back: Normal range of motion and neck supple. No rigidity or tenderness.     Right lower leg: No edema.     Left lower leg: No edema.  Lymphadenopathy:     Cervical: No cervical adenopathy.  Skin:    General: Skin is warm and dry.  Neurological:     General: No focal deficit present.     Mental Status: He is alert and oriented to person, place, and time.     Cranial Nerves: No cranial nerve deficit.     Sensory: No sensory deficit.     Motor: No weakness.     Coordination: Coordination normal.     Gait: Gait normal.     Deep Tendon Reflexes: Reflexes normal.  Psychiatric:        Mood and Affect: Mood normal.        Behavior: Behavior normal.        Thought Content: Thought content normal.        Judgment:  Judgment normal.        Assessment:     1. Physical exam   2. GAD (generalized anxiety disorder)        Plan:     Good diet and exercise reviewed Continue with reg eye and dental visits Seatbelt use reviewed Discussed in the future may want to wean off med since currently on 3-4 years He would like to continue for now and states has rf until Dec Pt cautioned against weaning/stopping cold [place] himself F/U in 6 months STE reviewed

## 2020-07-04 ENCOUNTER — Ambulatory Visit (INDEPENDENT_AMBULATORY_CARE_PROVIDER_SITE_OTHER): Payer: 59 | Admitting: Family Medicine

## 2020-07-04 ENCOUNTER — Encounter: Payer: Self-pay | Admitting: Family Medicine

## 2020-07-04 DIAGNOSIS — J0110 Acute frontal sinusitis, unspecified: Secondary | ICD-10-CM

## 2020-07-04 DIAGNOSIS — R059 Cough, unspecified: Secondary | ICD-10-CM | POA: Diagnosis not present

## 2020-07-04 MED ORDER — AMOXICILLIN-POT CLAVULANATE 875-125 MG PO TABS: 1.0000 | ORAL_TABLET | Freq: Two times a day (BID) | ORAL | 0 refills | Status: DC

## 2020-07-04 NOTE — Progress Notes (Signed)
Subjective:    Patient ID: Jacob Jennings, male    DOB: 09/28/1995, 24 y.o.   MRN: [id number]   HPI: Jacob Jennings is a 24 y.o. male presenting for  HA described as pressure in the front of the head, Not facial, but involves forehead. Slight fever to 99.4. Slight cough. Nonproductive. Onset late in the day yesterday. Appetite is good.    Depression screen [facility] 2/9 03/16/2020 09/14/2019 03/15/2019 09/13/2018 03/18/2018  Decreased Interest 0 0 0 0 0  Down, Depressed, Hopeless 0 0 0 0 0  PHQ - 2 Score 0 0 0 0 0  Altered sleeping - - - - -  Tired, decreased energy - - - - -  Change in appetite - - - - -  Feeling bad or failure about yourself  - - - - -  Trouble concentrating - - - - -  Moving slowly or fidgety/restless - - - - -  Suicidal thoughts - - - - -  PHQ-9 Score - - - - -     Relevant past medical, surgical, family and social history reviewed and updated as indicated.  Interim medical history since our last visit reviewed. Allergies and medications reviewed and updated.  ROS:  Review of Systems  Constitutional: Negative for activity change, appetite change, chills and fever.  HENT: Positive for congestion and postnasal drip. Negative for ear pain, hearing loss, rhinorrhea, sinus pressure, sneezing and trouble swallowing.   Respiratory: Negative for chest tightness and shortness of breath.   Cardiovascular: Negative for chest pain and palpitations.  Gastrointestinal: Negative for abdominal pain, diarrhea, nausea and vomiting.  Skin: Negative for rash.     Social History   Tobacco Use  Smoking Status Never Smoker  Smokeless Tobacco Never Used       Objective:     Wt Readings from Last 3 Encounters:  03/16/20 203 lb 9.6 oz (92.4 kg)  09/14/19 197 lb 9.6 oz (89.6 kg)  03/15/19 193 lb (87.5 kg)     Exam deferred. Pt. Harboring due to COVID 19. Phone visit performed.   Assessment & Plan:   1. Cough   2. Acute non-recurrent frontal sinusitis     Meds  ordered this encounter  Medications  . amoxicillin-clavulanate (AUGMENTIN) 875-125 MG tablet    Sig: Take 1 tablet by mouth 2 (two) times daily. Take all of this medication    Dispense:  20 tablet    Refill:  0    Orders Placed This Encounter  Procedures  . Novel Coronavirus, NAA ([facility])    Order Specific Question:   Is this test for diagnosis or screening    Answer:   Diagnosis of ill patient    Order Specific Question:   Symptomatic for COVID-19 as defined by CDC    Answer:   Yes    Order Specific Question:   Date of Symptom Onset    Answer:   07/03/2020    Order Specific Question:   Hospitalized for COVID-19    Answer:   No    Order Specific Question:   Admitted to ICU for COVID-19    Answer:   No    Order Specific Question:   Previously tested for COVID-19    Answer:   No    Order Specific Question:   Resident in a congregate (group) care setting    Answer:   No    Order Specific Question:   Is the patient student?    Answer:  No    Order Specific Question:   Employed in healthcare setting    Answer:   No    Order Specific Question:   Release to patient    Answer:   Immediate      Diagnoses and all orders for this visit:  Cough -     Novel Coronavirus, NAA ([facility])  Acute non-recurrent frontal sinusitis -     Novel Coronavirus, NAA ([facility])  Other orders -     amoxicillin-clavulanate (AUGMENTIN) 875-125 MG tablet; Take 1 tablet by mouth 2 (two) times daily. Take all of this medication    Virtual Visit via telephone Note  I discussed the limitations, risks, security and privacy concerns of performing an evaluation and management service by telephone and the availability of in person appointments. The patient was identified with two identifiers. Pt.expressed understanding and agreed to proceed. Pt. Is at home. Dr. Darlyn Read is in his office.  Follow Up Instructions:   I discussed the assessment and treatment plan with the patient. The patient was provided an  opportunity to ask questions and all were answered. The patient agreed with the plan and demonstrated an understanding of the instructions.   The patient was advised to call back or seek an in-person evaluation if the symptoms worsen or if the condition fails to improve as anticipated.   Total minutes including chart review and phone contact time: 11   Follow up plan: Return if symptoms worsen or fail to improve.  Mechele Claude, MD [place]

## 2020-07-05 LAB — NOVEL CORONAVIRUS, NAA: SARS-CoV-2, NAA: NOT DETECTED

## 2020-07-05 LAB — SARS-COV-2, NAA 2 DAY TAT

## 2020-07-06 ENCOUNTER — Telehealth: Payer: Self-pay

## 2020-07-06 ENCOUNTER — Encounter: Payer: Self-pay | Admitting: Emergency Medicine

## 2020-07-06 ENCOUNTER — Ambulatory Visit
Admission: EM | Admit: 2020-07-06 | Discharge: 2020-07-06 | Disposition: A | Discharge status: Home / Self Care | Payer: 59 | Attending: Emergency Medicine | Admitting: Emergency Medicine

## 2020-07-06 ENCOUNTER — Other Ambulatory Visit: Payer: 59

## 2020-07-06 DIAGNOSIS — R509 Fever, unspecified: Secondary | ICD-10-CM

## 2020-07-06 DIAGNOSIS — R059 Cough, unspecified: Secondary | ICD-10-CM

## 2020-07-06 DIAGNOSIS — R519 Headache, unspecified: Secondary | ICD-10-CM

## 2020-07-06 MED ORDER — ONDANSETRON HCL 4 MG PO TABS: 4.0000 mg | ORAL_TABLET | Freq: Four times a day (QID) | ORAL | 0 refills | Status: DC

## 2020-07-06 MED ORDER — MELOXICAM 15 MG PO TABS: 15.0000 mg | ORAL_TABLET | Freq: Every day | ORAL | 0 refills | Status: DC

## 2020-07-06 NOTE — ED Triage Notes (Signed)
Tuesday pt had headache, given abx from tele visit for sinus infection, had neg pcr covid test.  Pt reports he has had a fever as high as 102.9 early this morning.  Took 1 gram tylenol at 1430 today, temp was 101.5. Has had some mild nausea too.

## 2020-07-06 NOTE — Discharge Instructions (Signed)
Symptoms may be viral and that is why they are not responding to the antibiotic Get plenty of rest and push fluids Mobic for pain Use OTC zyrtec for nasal congestion, runny nose, and/or sore throat Use OTC flonase for nasal congestion and runny nose Use medications daily for symptom relief Use OTC medications like ibuprofen or tylenol as needed fever or pain Call or go to the ED if you have any new or worsening symptoms such as fever, cough, shortness of breath, chest tightness, chest pain, turning blue, changes in mental status, etc..Marland Kitchen

## 2020-07-06 NOTE — Telephone Encounter (Signed)
Per Dettinger get patient to come back in and get retested and treat symptoms no SOB or cough

## 2020-07-06 NOTE — ED Provider Notes (Signed)
[facility]   [id number] 07/06/20 Arrival Time: 1751   CC: COVID symptoms  SUBJECTIVE: History from: patient.  Jacob Jennings is a 24 y.o. male who presents with fever, tmax of 102, this morning and HA x 3 days.  Denies sick exposure to COVID, flu or strep.  Had a e-visit with PCP.  COVID negative and retest.  Results pending.  Treated with augmentin without relief.  Reports previous symptoms in the past.   Denies chills, sinus pain, rhinorrhea, sore throat, cough, SOB, wheezing, chest pain, nausea, changes in bowel or bladder habits.    ROS: As per HPI.  All other pertinent ROS negative.     Past Medical History:  Diagnosis Date  . Anxiety   . Depression    Past Surgical History:  Procedure Laterality Date  . oral lesion biopsy  2016  . WISDOM TOOTH EXTRACTION     No Known Allergies No current facility-administered medications on file prior to encounter.   Current Outpatient Medications on File Prior to Encounter  Medication Sig Dispense Refill  . amoxicillin-clavulanate (AUGMENTIN) 875-125 MG tablet Take 1 tablet by mouth 2 (two) times daily. Take all of this medication 20 tablet 0  . escitalopram (LEXAPRO) 20 MG tablet Take 1 tablet (20 mg total) by mouth at bedtime. 90 tablet 3   Social History   Socioeconomic History  . Marital status: Single    Spouse name: Not on file  . Number of children: Not on file  . Years of education: Not on file  . Highest education level: Not on file  Occupational History  . Not on file  Tobacco Use  . Smoking status: Never Smoker  . Smokeless tobacco: Never Used  Vaping Use  . Vaping Use: Never used  Substance and Sexual Activity  . Alcohol use: No  . Drug use: No  . Sexual activity: Never    Birth control/protection: Abstinence  Other Topics Concern  . Not on file  Social History Narrative   Holiday representative, with private contracting   Social Determinants of Health   Financial Resource Strain:   . Difficulty of  Paying Living Expenses: Not on file  Food Insecurity:   . Worried About Programme researcher, broadcasting/film/video in the Last Year: Not on file  . Ran Out of Food in the Last Year: Not on file  Transportation Needs:   . Lack of Transportation (Medical): Not on file  . Lack of Transportation (Non-Medical): Not on file  Physical Activity:   . Days of Exercise per Week: Not on file  . Minutes of Exercise per Session: Not on file  Stress:   . Feeling of Stress : Not on file  Social Connections:   . Frequency of Communication with Friends and Family: Not on file  . Frequency of Social Gatherings with Friends and Family: Not on file  . Attends Religious Services: Not on file  . Active Member of Clubs or Organizations: Not on file  . Attends Banker Meetings: Not on file  . Marital Status: Not on file  Intimate Partner Violence:   . Fear of Current or Ex-Partner: Not on file  . Emotionally Abused: Not on file  . Physically Abused: Not on file  . Sexually Abused: Not on file   Family History  Problem Relation Age of Onset  . Diabetes Mother   . Arthritis Father   . Hearing loss Father   . Hypertension Father   . Drug abuse Brother   .  Heart disease Maternal Grandmother   . Early death Maternal Grandfather 61  . Heart disease Maternal Grandfather   . Diabetes Maternal Grandfather   . Cancer Paternal Grandmother        thyroid    OBJECTIVE:  Vitals:   07/06/20 1901 07/06/20 1902  BP:  100/70  Pulse:  (!) 107  Resp:  18  Temp:  99.9 F (37.7 C)  TempSrc:  Oral  SpO2:  96%  Weight: 200 lb (90.7 kg)   Height: 5\' 11"  (1.803 m)      General appearance: alert; appears mildly fatigued, but nontoxic; speaking in full sentences and tolerating own secretions HEENT: NCAT; Ears: EACs clear, TMs pearly gray; Eyes: PERRL.  EOM grossly intact. Nose: nares patent without rhinorrhea, Throat: oropharynx clear, tonsils non erythematous or enlarged, uvula midline  Neck: supple without LAD Lungs:  unlabored respirations, symmetrical air entry; cough: absent; no respiratory distress; CTAB Heart: regular rate and rhythm.   Skin: warm and dry Psychological: alert and cooperative; normal mood and affect  ASSESSMENT & PLAN:  1. Fever, unspecified   2. Acute nonintractable headache, unspecified headache type     Meds ordered this encounter  Medications  . meloxicam (MOBIC) 15 MG tablet    Sig: Take 1 tablet (15 mg total) by mouth daily.    Dispense:  30 tablet    Refill:  0    Order Specific Question:   Supervising Provider    Answer:   Eustace Moore   Symptoms may be viral and that is why they are not responding to the antibiotic Get plenty of rest and push fluids Mobic for pain Use OTC zyrtec for nasal congestion, runny nose, and/or sore throat Use OTC flonase for nasal congestion and runny nose Use medications daily for symptom relief Use OTC medications like ibuprofen or tylenol as needed fever or pain Call or go to the ED if you have any new or worsening symptoms such as fever, cough, shortness of breath, chest tightness, chest pain, turning blue, changes in mental status, etc...   Reviewed expectations re: course of current medical issues. Questions answered. Outlined signs and symptoms indicating need for more acute intervention. Patient verbalized understanding. After Visit Summary given.         [[id number]], PA-C 07/06/20 1925

## 2020-07-06 NOTE — Telephone Encounter (Signed)
Pts mom called stating that Jacob Jennings had a televisit with Dr Darlyn Read on wed and then was tested for covid. Says Jacob Jennings is now running a fever and is throwing up. Wants to know if there is anything that can be called in for Jacob Jennings to help with symptoms. Please call asap.

## 2020-07-06 NOTE — Telephone Encounter (Signed)
It looks like Dr. Darlyn Read called in Augmentin, on top of that I would say he could use over-the-counter Flonase and Tylenol and ibuprofen.  We can call him in a prescription for Zofran 4 mg ODT every 8 hours as needed and give him 30 tablets no refills. Arville Care, MD [facility] 07/06/2020, 3:52 PM

## 2020-07-07 LAB — SARS-COV-2, NAA 2 DAY TAT

## 2020-07-07 LAB — NOVEL CORONAVIRUS, NAA: SARS-CoV-2, NAA: NOT DETECTED

## 2020-07-23 ENCOUNTER — Encounter: Payer: Self-pay | Admitting: Family

## 2020-07-23 ENCOUNTER — Ambulatory Visit (INDEPENDENT_AMBULATORY_CARE_PROVIDER_SITE_OTHER): Payer: 59 | Admitting: Family

## 2020-07-23 ENCOUNTER — Other Ambulatory Visit: Payer: Self-pay

## 2020-07-23 ENCOUNTER — Other Ambulatory Visit (HOSPITAL_COMMUNITY)
Admission: RE | Admit: 2020-07-23 | Discharge: 2020-07-23 | Disposition: A | Discharge status: Home / Self Care | Payer: 59 | Source: Ambulatory Visit | Attending: Family | Admitting: Family

## 2020-07-23 DIAGNOSIS — B349 Viral infection, unspecified: Secondary | ICD-10-CM

## 2020-07-23 DIAGNOSIS — R509 Fever, unspecified: Secondary | ICD-10-CM

## 2020-07-23 LAB — CBC WITH DIFFERENTIAL/PLATELET
Abs Immature Granulocytes: 0.03 10*3/uL (ref 0.00–0.07)
Basophils Absolute: 0.1 10*3/uL (ref 0.0–0.1)
Basophils Relative: 1 %
Eosinophils Absolute: 0 10*3/uL (ref 0.0–0.5)
Eosinophils Relative: 0 %
HCT: 46.9 % (ref 39.0–52.0)
Hemoglobin: 15.6 g/dL (ref 13.0–17.0)
Immature Granulocytes: 0 %
Lymphocytes Relative: 53 %
Lymphs Abs: 4.4 10*3/uL — ABNORMAL HIGH (ref 0.7–4.0)
MCH: 29.3 pg (ref 26.0–34.0)
MCHC: 33.3 g/dL (ref 30.0–36.0)
MCV: 88 fL (ref 80.0–100.0)
Monocytes Absolute: 0.9 10*3/uL (ref 0.1–1.0)
Monocytes Relative: 10 %
Neutro Abs: 3 10*3/uL (ref 1.7–7.7)
Neutrophils Relative %: 36 %
Platelets: 211 10*3/uL (ref 150–400)
RBC: 5.33 MIL/uL (ref 4.22–5.81)
RDW: 12.9 % (ref 11.5–15.5)
WBC: 8.3 10*3/uL (ref 4.0–10.5)
nRBC: 0 % (ref 0.0–0.2)

## 2020-07-23 NOTE — Progress Notes (Signed)
Virtual Visit via telephone Note Due to COVID-19 pandemic this visit was conducted virtually. This visit type was conducted due to national recommendations for restrictions regarding the COVID-19 Pandemic (e.g. social distancing, sheltering in place) in an effort to limit this patient's exposure and mitigate transmission in our community. All issues noted in this document were discussed and addressed.  A physical exam was not performed with this format.  I connected with Jacob Jennings on 07/23/20 at 10:02 AM  by telephone and verified that I am speaking with the correct person using two identifiers. Jacob Jennings is currently located at home and mother is currently with him during visit. The provider, Jannifer Rodney, FNP is located in their office at time of visit.  I discussed the limitations, risks, security and privacy concerns of performing an evaluation and management service by telephone and the availability of in person appointments. I also discussed with the patient that there may be a patient responsible charge related to this service. The patient expressed understanding and agreed to proceed.   History and Present Illness:  PT calls the office today with recurrent fever. He had a fever and sinus pain around 07/03/20. He was prescribed Augmentin. He continued to have fevers and went to the Urgent Care on 07/06/20 and was diagnosed with a viral illness. He was negative COVID on 07/04/20 & 07/06/20.  Denies any sick contacts.  He reports he does not feel as bad as he need a few weeks ago.  Fever  This is a recurrent problem. The current episode started in the past 7 days (Saturday). The problem occurs constantly. The problem has been unchanged. The maximum temperature noted was 100 to 100.9 F. Associated symptoms include headaches and nausea. Pertinent negatives include no congestion, coughing, diarrhea, ear pain, muscle aches, sleepiness, sore throat, vomiting or wheezing. He has tried  NSAIDs and acetaminophen for the symptoms. The treatment provided mild relief.     Review of Systems  Constitutional: Positive for fever.  HENT: Negative for congestion, ear pain and sore throat.   Respiratory: Negative for cough and wheezing.   Gastrointestinal: Positive for nausea. Negative for diarrhea and vomiting.  Neurological: Positive for headaches.  All other systems reviewed and are negative.    Observations/Objective: No SOB or distress noted, no pain when pushing on his stomach or facial pain when pushing on his sinuses.   Assessment and Plan: 1. Fever, unspecified fever cause - CBC with Differential/Platelet; Future  2. Viral illness - CBC with Differential/Platelet; Future  Two negative COVID tests Fever on and off since 07/03/20, will do CBC to rule out any infection or blood disorders No acute findings on exam- Denies all symptoms other than fever, very mild headache, and mild nausea at times.  Rest Force fluids Tylenol or Motrin as needed    I discussed the assessment and treatment plan with the patient. The patient was provided an opportunity to ask questions and all were answered. The patient agreed with the plan and demonstrated an understanding of the instructions.   The patient was advised to call back or seek an in-person evaluation if the symptoms worsen or if the condition fails to improve as anticipated.  The above assessment and management plan was discussed with the patient. The patient verbalized understanding of and has agreed to the management plan. Patient is aware to call the clinic if symptoms persist or worsen. Patient is aware when to return to the clinic for a follow-up visit. Patient educated  on when it is appropriate to go to the emergency department.   Time call ended:  10:16 AM   I provided 14 minutes of non-face-to-face time during this encounter.    Jannifer Rodney, FNP

## 2020-07-25 ENCOUNTER — Encounter: Payer: Self-pay | Admitting: Family Medicine

## 2020-07-30 ENCOUNTER — Encounter: Payer: Self-pay | Admitting: Family Medicine

## 2020-07-30 ENCOUNTER — Ambulatory Visit (INDEPENDENT_AMBULATORY_CARE_PROVIDER_SITE_OTHER): Payer: 59 | Admitting: Family Medicine

## 2020-07-30 VITALS — BP 138/86 | HR 106 | Temp 98.0°F

## 2020-07-30 DIAGNOSIS — R509 Fever, unspecified: Secondary | ICD-10-CM | POA: Diagnosis not present

## 2020-07-30 DIAGNOSIS — R519 Headache, unspecified: Secondary | ICD-10-CM

## 2020-07-30 DIAGNOSIS — J0110 Acute frontal sinusitis, unspecified: Secondary | ICD-10-CM | POA: Diagnosis not present

## 2020-07-30 MED ORDER — LEVOFLOXACIN 500 MG PO TABS: 500.0000 mg | ORAL_TABLET | Freq: Every day | ORAL | 0 refills | Status: DC

## 2020-07-30 NOTE — Progress Notes (Signed)
Subjective:  Patient ID: Jacob Jennings, male    DOB: March 23, 1996  Age: 24 y.o. MRN: [id number]  CC: Headache, Fever, and Emesis   HPI Jacob Jennings presents for ongoing fever and headache for about 4 weeks now.  He was seen on 13 October through a telephone visit and at that time fever and cough were his main symptoms.  Since that time he says he has had about 3 episodes of vomiting.  Both of his Covid test done just a few days apart were negative.  Those were 3 weeks ago now.  He continues to have frontal headache and congestion and his scalp is sensitive in the occipital region as well.  He has not lost his sense of taste.  He has no earache stuffy congested nose sore throat or loss of sense of taste and smell.  Depression screen [facility] 2/9 07/30/2020 03/16/2020 09/14/2019  Decreased Interest 0 0 0  Down, Depressed, Hopeless 0 0 0  PHQ - 2 Score 0 0 0  Altered sleeping - - -  Tired, decreased Jennings - - -  Change in appetite - - -  Feeling bad or failure about yourself  - - -  Trouble concentrating - - -  Moving slowly or fidgety/restless - - -  Suicidal thoughts - - -  PHQ-9 Score - - -    History Jacob Jennings has a past medical history of Anxiety and Depression.   He has a past surgical history that includes Wisdom tooth extraction and oral lesion biopsy (2016).   His family history includes Arthritis in his father; Cancer in his paternal grandmother; Diabetes in his maternal grandfather and mother; Drug abuse in his brother; Early death (age of onset: 55) in his maternal grandfather; Hearing loss in his father; Heart disease in his maternal grandfather and maternal grandmother; Hypertension in his father.He reports that he has never smoked. He has never used smokeless tobacco. He reports that he does not drink alcohol and does not use drugs.    ROS Review of Systems  Constitutional: Positive for fatigue and fever.  HENT: Negative for congestion, sinus pressure and sore throat.     Gastrointestinal: Positive for nausea and vomiting. Negative for abdominal pain and diarrhea.  Neurological: Positive for headaches (Frontal).    Objective:  BP 138/86   Pulse (!) 106   Temp 98 F (36.7 C) (Temporal)   BP Readings from Last 3 Encounters:  07/30/20 138/86  07/06/20 100/70  03/16/20 114/74    Wt Readings from Last 3 Encounters:  07/06/20 200 lb (90.7 kg)  03/16/20 203 lb 9.6 oz (92.4 kg)  09/14/19 197 lb 9.6 oz (89.6 kg)     Physical Exam Vitals reviewed.  Constitutional:      Appearance: He is well-developed. He is not ill-appearing.  HENT:     Head: Normocephalic and atraumatic.     Comments: Frontal sinuses tender    Right Ear: External ear normal.     Left Ear: External ear normal.     Mouth/Throat:     Pharynx: No oropharyngeal exudate or posterior oropharyngeal erythema.  Eyes:     Pupils: Pupils are equal, round, and reactive to light.  Cardiovascular:     Rate and Rhythm: Normal rate and regular rhythm.     Heart sounds: No murmur heard.   Pulmonary:     Effort: No respiratory distress.     Breath sounds: Normal breath sounds.  Musculoskeletal:     Cervical back:  Normal range of motion and neck supple.  Neurological:     Mental Status: He is alert and oriented to person, place, and time.  Psychiatric:        Mood and Affect: Mood normal.        Speech: Speech normal.        Behavior: Behavior normal.       Assessment & Plan:   Jacob Jennings was seen today for headache, fever and emesis.  Diagnoses and all orders for this visit:  Acute nonintractable headache, unspecified headache type -     Novel Coronavirus, NAA ([facility])  Fever, unspecified fever cause -     Novel Coronavirus, NAA ([facility])  Acute non-recurrent frontal sinusitis  Other orders -     levofloxacin (LEVAQUIN) 500 MG tablet; Take 1 tablet (500 mg total) by mouth daily. For 10 days       I am having Jacob Jennings" start on levofloxacin. I am also  having him maintain his escitalopram, meloxicam, and ondansetron.  Allergies as of 07/30/2020   No Known Allergies     Medication List       Accurate as of July 30, 2020  4:54 PM. If you have any questions, ask your nurse or doctor.        escitalopram 20 MG tablet Commonly known as: LEXAPRO Take 1 tablet (20 mg total) by mouth at bedtime.   levofloxacin 500 MG tablet Commonly known as: LEVAQUIN Take 1 tablet (500 mg total) by mouth daily. For 10 days Started by: Jacob Claude, MD   meloxicam 15 MG tablet Commonly known as: Mobic Take 1 tablet (15 mg total) by mouth daily.   ondansetron 4 MG tablet Commonly known as: ZOFRAN Take 1 tablet (4 mg total) by mouth every 6 (six) hours.        Follow-up: No follow-ups on file.  Jacob Jennings, M.D.

## 2020-07-31 LAB — NOVEL CORONAVIRUS, NAA: SARS-CoV-2, NAA: NOT DETECTED

## 2020-07-31 LAB — SARS-COV-2, NAA 2 DAY TAT

## 2020-08-02 NOTE — Progress Notes (Signed)
Hello Cauy,  Your lab result is normal and/or stable.Some minor variations that are not significant are commonly marked abnormal, but do not represent any medical problem for you.  Best regards, Lakindra Wible, M.D.

## 2020-09-28 DIAGNOSIS — F411 Generalized anxiety disorder: Secondary | ICD-10-CM | POA: Diagnosis not present

## 2020-09-28 DIAGNOSIS — F331 Major depressive disorder, recurrent, moderate: Secondary | ICD-10-CM | POA: Diagnosis not present

## 2020-10-04 ENCOUNTER — Other Ambulatory Visit: Payer: Self-pay | Admitting: Family Medicine

## 2020-10-04 DIAGNOSIS — F411 Generalized anxiety disorder: Secondary | ICD-10-CM

## 2020-10-12 DIAGNOSIS — F331 Major depressive disorder, recurrent, moderate: Secondary | ICD-10-CM | POA: Diagnosis not present

## 2020-10-12 DIAGNOSIS — F411 Generalized anxiety disorder: Secondary | ICD-10-CM | POA: Diagnosis not present

## 2020-10-26 DIAGNOSIS — F411 Generalized anxiety disorder: Secondary | ICD-10-CM | POA: Diagnosis not present

## 2020-10-26 DIAGNOSIS — F331 Major depressive disorder, recurrent, moderate: Secondary | ICD-10-CM | POA: Diagnosis not present

## 2020-11-05 ENCOUNTER — Other Ambulatory Visit: Payer: Self-pay | Admitting: Family Medicine

## 2020-11-05 DIAGNOSIS — F411 Generalized anxiety disorder: Secondary | ICD-10-CM

## 2020-11-05 NOTE — Telephone Encounter (Signed)
Dettinger NTBS 30 days given 10/04/20

## 2020-11-06 ENCOUNTER — Telehealth: Payer: Self-pay

## 2020-11-06 DIAGNOSIS — F411 Generalized anxiety disorder: Secondary | ICD-10-CM

## 2020-11-06 MED ORDER — ESCITALOPRAM OXALATE 20 MG PO TABS: 20.0000 mg | ORAL_TABLET | Freq: Every day | ORAL | 0 refills | Status: DC

## 2020-11-06 NOTE — Telephone Encounter (Signed)
Aware refill sent to pharmacy ?

## 2020-11-06 NOTE — Telephone Encounter (Signed)
  Prescription Request  11/06/2020  What is the name of the medication or equipment? Escitalopram 20 mg Patient has appt 3-16 and needs enough called in to get him through to his app  Have you contacted your pharmacy to request a refill? (if applicable) YES  Which pharmacy would you like this sent to? [facility] in [place]   Patient notified that their request is being sent to the clinical staff for review and that they should receive a response within 2 business days.

## 2020-11-14 DIAGNOSIS — F331 Major depressive disorder, recurrent, moderate: Secondary | ICD-10-CM | POA: Diagnosis not present

## 2020-11-14 DIAGNOSIS — F411 Generalized anxiety disorder: Secondary | ICD-10-CM | POA: Diagnosis not present

## 2020-11-22 DIAGNOSIS — F331 Major depressive disorder, recurrent, moderate: Secondary | ICD-10-CM | POA: Diagnosis not present

## 2020-11-22 DIAGNOSIS — F411 Generalized anxiety disorder: Secondary | ICD-10-CM | POA: Diagnosis not present

## 2020-12-03 DIAGNOSIS — F411 Generalized anxiety disorder: Secondary | ICD-10-CM | POA: Diagnosis not present

## 2020-12-03 DIAGNOSIS — Z79891 Long term (current) use of opiate analgesic: Secondary | ICD-10-CM | POA: Diagnosis not present

## 2020-12-03 DIAGNOSIS — F331 Major depressive disorder, recurrent, moderate: Secondary | ICD-10-CM | POA: Diagnosis not present

## 2020-12-04 DIAGNOSIS — F411 Generalized anxiety disorder: Secondary | ICD-10-CM | POA: Diagnosis not present

## 2020-12-04 DIAGNOSIS — F331 Major depressive disorder, recurrent, moderate: Secondary | ICD-10-CM | POA: Diagnosis not present

## 2020-12-05 ENCOUNTER — Ambulatory Visit: Payer: 59 | Admitting: Family Medicine

## 2020-12-05 ENCOUNTER — Encounter: Payer: Self-pay | Admitting: Family Medicine

## 2020-12-05 ENCOUNTER — Other Ambulatory Visit: Payer: Self-pay

## 2020-12-05 VITALS — BP 114/70 | HR 75 | Ht 71.0 in | Wt 199.0 lb

## 2020-12-05 DIAGNOSIS — F411 Generalized anxiety disorder: Secondary | ICD-10-CM

## 2020-12-05 DIAGNOSIS — Z23 Encounter for immunization: Secondary | ICD-10-CM

## 2020-12-05 DIAGNOSIS — F339 Major depressive disorder, recurrent, unspecified: Secondary | ICD-10-CM

## 2020-12-05 NOTE — Progress Notes (Signed)
BP 114/70   Pulse 75   Ht 5\' 11"  (1.803 m)   Wt 199 lb (90.3 kg)   SpO2 95%   BMI 27.75 kg/m    Subjective:   Patient ID: , male    DOB: August 02, 1996, 25 y.o.   MRN: [id number]  HPI: Jacob Jennings is a 25 y.o. male presenting on 12/05/2020 for Medical Management of Chronic Issues and Depression   HPI Depression and anxiety. Patient is coming to discuss depression anxiety and get blood work.  He started seeing 12/07/2020 just earlier this week with [facility] and just started on the medication last week, started on Prozac 10 mg and and then will go up to 20 mg next week.  He says is still early on the medicine so does not know fully how he is doing.  He denies any suicidal ideations or thoughts of hurting himself. Depression screen [facility] 2/9 12/05/2020 07/30/2020 03/16/2020 09/14/2019 03/15/2019  Decreased Interest 1 0 0 0 0  Down, Depressed, Hopeless 1 0 0 0 0  PHQ - 2 Score 2 0 0 0 0  Altered sleeping 0 - - - -  Tired, decreased energy 3 - - - -  Change in appetite 1 - - - -  Feeling bad or failure about yourself  2 - - - -  Trouble concentrating 0 - - - -  Moving slowly or fidgety/restless 0 - - - -  Suicidal thoughts 0 - - - -  PHQ-9 Score 8 - - - -    Otherwise healthwise he feels like he is doing pretty good denies any major issues.  They did want some blood work from psychiatry.  Relevant past medical, surgical, family and social history reviewed and updated as indicated. Interim medical history since our last visit reviewed. Allergies and medications reviewed and updated.  Review of Systems  Constitutional: Negative for chills and fever.  Eyes: Negative for discharge.  Respiratory: Negative for shortness of breath and wheezing.   Cardiovascular: Negative for chest pain and leg swelling.  Musculoskeletal: Negative for back pain and gait problem.  Skin: Negative for rash.  Psychiatric/Behavioral: Positive for dysphoric mood and sleep disturbance.  Negative for self-injury and suicidal ideas. The patient is nervous/anxious.   All other systems reviewed and are negative.   Per HPI unless specifically indicated above   Allergies as of 12/05/2020   No Known Allergies     Medication List       Accurate as of December 05, 2020  2:40 PM. If you have any questions, ask your nurse or doctor.        STOP taking these medications   escitalopram 20 MG tablet Commonly known as: LEXAPRO Stopped by: December 07, 2020, MD   levofloxacin 500 MG tablet Commonly known as: LEVAQUIN Stopped by: Nils Pyle Dettinger, MD   meloxicam 15 MG tablet Commonly known as: Mobic Stopped by: Elige Radon Dettinger, MD   ondansetron 4 MG tablet Commonly known as: ZOFRAN Stopped by: Elige Radon Dettinger, MD        Objective:   BP 114/70   Pulse 75   Ht 5\' 11"  (1.803 m)   Wt 199 lb (90.3 kg)   SpO2 95%   BMI 27.75 kg/m   Wt Readings from Last 3 Encounters:  12/05/20 199 lb (90.3 kg)  07/06/20 200 lb (90.7 kg)  03/16/20 203 lb 9.6 oz (92.4 kg)    Physical Exam Vitals and nursing note reviewed.  Constitutional:      General: He is not in acute distress.    Appearance: He is well-developed. He is not diaphoretic.  Eyes:     General: No scleral icterus.    Conjunctiva/sclera: Conjunctivae normal.  Neck:     Thyroid: No thyromegaly.  Cardiovascular:     Rate and Rhythm: Normal rate and regular rhythm.     Heart sounds: Normal heart sounds. No murmur heard.   Pulmonary:     Effort: Pulmonary effort is normal. No respiratory distress.     Breath sounds: Normal breath sounds. No wheezing.  Musculoskeletal:        General: Normal range of motion.     Cervical back: Neck supple.  Lymphadenopathy:     Cervical: No cervical adenopathy.  Skin:    General: Skin is warm and dry.     Findings: No rash.  Neurological:     Mental Status: He is alert and oriented to person, place, and time.     Coordination: Coordination normal.  Psychiatric:         Mood and Affect: Mood is anxious and depressed.        Behavior: Behavior normal.        Thought Content: Thought content does not include suicidal ideation. Thought content does not include suicidal plan.       Assessment & Plan:   Problem List Items Addressed This Visit      Other   Depression, recurrent (HCC)   GAD (generalized anxiety disorder)    Other Visit Diagnoses    Need for Tdap vaccination    -  Primary   Relevant Orders   Tdap vaccine greater than or equal to 7yo IM (Completed)    Will continue to follow-up with [facility] for [facility], will do blood work.  Follow up plan: Return in about 1 year (around 12/05/2021), or if symptoms worsen or fail to improve, for Physical.  Counseling provided for all of the vaccine components Orders Placed This Encounter  Procedures  . Tdap vaccine greater than or equal to 7yo IM    Arville Care, MD [facility] 12/05/2020, 2:40 PM

## 2020-12-06 LAB — CMP14+EGFR
ALT: 19 IU/L (ref 0–44)
AST: 18 IU/L (ref 0–40)
Albumin/Globulin Ratio: 2 (ref 1.2–2.2)
Albumin: 4.9 g/dL (ref 4.1–5.2)
Alkaline Phosphatase: 64 IU/L (ref 44–121)
BUN/Creatinine Ratio: 9 (ref 9–20)
BUN: 11 mg/dL (ref 6–20)
Bilirubin Total: 0.5 mg/dL (ref 0.0–1.2)
CO2: 22 mmol/L (ref 20–29)
Calcium: 10.1 mg/dL (ref 8.7–10.2)
Chloride: 102 mmol/L (ref 96–106)
Creatinine, Ser: 1.22 mg/dL (ref 0.76–1.27)
Globulin, Total: 2.5 g/dL (ref 1.5–4.5)
Glucose: 85 mg/dL (ref 65–99)
Potassium: 4.5 mmol/L (ref 3.5–5.2)
Sodium: 142 mmol/L (ref 134–144)
Total Protein: 7.4 g/dL (ref 6.0–8.5)
eGFR: 85 mL/min/{1.73_m2} (ref >59)

## 2020-12-06 LAB — CBC WITH DIFFERENTIAL/PLATELET
Basophils Absolute: 0.1 10*3/uL (ref 0.0–0.2)
Basos: 1 %
EOS (ABSOLUTE): 0.1 10*3/uL (ref 0.0–0.4)
Eos: 1 %
Hematocrit: 47.6 % (ref 37.5–51.0)
Hemoglobin: 16.5 g/dL (ref 13.0–17.7)
Immature Grans (Abs): 0 10*3/uL (ref 0.0–0.1)
Immature Granulocytes: 0 %
Lymphocytes Absolute: 3.2 10*3/uL — ABNORMAL HIGH (ref 0.7–3.1)
Lymphs: 38 %
MCH: 30.1 pg (ref 26.6–33.0)
MCHC: 34.7 g/dL (ref 31.5–35.7)
MCV: 87 fL (ref 79–97)
Monocytes Absolute: 0.7 10*3/uL (ref 0.1–0.9)
Monocytes: 9 %
Neutrophils Absolute: 4.2 10*3/uL (ref 1.4–7.0)
Neutrophils: 51 %
Platelets: 230 10*3/uL (ref 150–450)
RBC: 5.49 x10E6/uL (ref 4.14–5.80)
RDW: 13.1 % (ref 11.6–15.4)
WBC: 8.2 10*3/uL (ref 3.4–10.8)

## 2020-12-06 LAB — THYROID PANEL WITH TSH
Free Thyroxine Index: 2.1 (ref 1.2–4.9)
T3 Uptake Ratio: 30 % (ref 24–39)
T4, Total: 7 ug/dL (ref 4.5–12.0)
TSH: 1.84 u[IU]/mL (ref 0.450–4.500)

## 2020-12-06 LAB — VITAMIN B12: Vitamin B-12: 310 pg/mL (ref 232–1245)

## 2020-12-06 LAB — VITAMIN D 25 HYDROXY (VIT D DEFICIENCY, FRACTURES): Vit D, 25-Hydroxy: 23.5 ng/mL — ABNORMAL LOW (ref 30.0–100.0)

## 2020-12-25 DIAGNOSIS — F331 Major depressive disorder, recurrent, moderate: Secondary | ICD-10-CM | POA: Diagnosis not present

## 2020-12-25 DIAGNOSIS — F411 Generalized anxiety disorder: Secondary | ICD-10-CM | POA: Diagnosis not present

## 2020-12-31 DIAGNOSIS — F411 Generalized anxiety disorder: Secondary | ICD-10-CM | POA: Diagnosis not present

## 2020-12-31 DIAGNOSIS — F331 Major depressive disorder, recurrent, moderate: Secondary | ICD-10-CM | POA: Diagnosis not present

## 2021-01-10 DIAGNOSIS — F331 Major depressive disorder, recurrent, moderate: Secondary | ICD-10-CM | POA: Diagnosis not present

## 2021-01-10 DIAGNOSIS — F411 Generalized anxiety disorder: Secondary | ICD-10-CM | POA: Diagnosis not present

## 2021-01-30 DIAGNOSIS — F331 Major depressive disorder, recurrent, moderate: Secondary | ICD-10-CM | POA: Diagnosis not present

## 2021-01-30 DIAGNOSIS — F411 Generalized anxiety disorder: Secondary | ICD-10-CM | POA: Diagnosis not present

## 2021-02-21 DIAGNOSIS — F331 Major depressive disorder, recurrent, moderate: Secondary | ICD-10-CM | POA: Diagnosis not present

## 2021-02-21 DIAGNOSIS — F411 Generalized anxiety disorder: Secondary | ICD-10-CM | POA: Diagnosis not present

## 2021-03-01 DIAGNOSIS — F411 Generalized anxiety disorder: Secondary | ICD-10-CM | POA: Diagnosis not present

## 2021-03-01 DIAGNOSIS — F331 Major depressive disorder, recurrent, moderate: Secondary | ICD-10-CM | POA: Diagnosis not present

## 2021-03-15 DIAGNOSIS — F331 Major depressive disorder, recurrent, moderate: Secondary | ICD-10-CM | POA: Diagnosis not present

## 2021-03-15 DIAGNOSIS — F411 Generalized anxiety disorder: Secondary | ICD-10-CM | POA: Diagnosis not present

## 2021-03-29 DIAGNOSIS — F331 Major depressive disorder, recurrent, moderate: Secondary | ICD-10-CM | POA: Diagnosis not present

## 2021-03-29 DIAGNOSIS — F411 Generalized anxiety disorder: Secondary | ICD-10-CM | POA: Diagnosis not present

## 2021-04-05 DIAGNOSIS — F411 Generalized anxiety disorder: Secondary | ICD-10-CM | POA: Diagnosis not present

## 2021-04-05 DIAGNOSIS — F331 Major depressive disorder, recurrent, moderate: Secondary | ICD-10-CM | POA: Diagnosis not present

## 2021-04-26 DIAGNOSIS — F331 Major depressive disorder, recurrent, moderate: Secondary | ICD-10-CM | POA: Diagnosis not present

## 2021-04-26 DIAGNOSIS — F411 Generalized anxiety disorder: Secondary | ICD-10-CM | POA: Diagnosis not present

## 2021-05-17 DIAGNOSIS — F331 Major depressive disorder, recurrent, moderate: Secondary | ICD-10-CM | POA: Diagnosis not present

## 2021-05-17 DIAGNOSIS — F411 Generalized anxiety disorder: Secondary | ICD-10-CM | POA: Diagnosis not present

## 2021-06-07 DIAGNOSIS — F411 Generalized anxiety disorder: Secondary | ICD-10-CM | POA: Diagnosis not present

## 2021-06-07 DIAGNOSIS — F331 Major depressive disorder, recurrent, moderate: Secondary | ICD-10-CM | POA: Diagnosis not present

## 2021-06-14 ENCOUNTER — Ambulatory Visit (HOSPITAL_COMMUNITY)
Admission: EM | Admit: 2021-06-14 | Discharge: 2021-06-15 | Disposition: A | Discharge status: Home / Self Care | Payer: 59 | Attending: Emergency Medicine | Admitting: Emergency Medicine

## 2021-06-14 ENCOUNTER — Ambulatory Visit (HOSPITAL_COMMUNITY): Admission: EM | Admit: 2021-06-14 | Discharge: 2021-06-14 | Discharge status: Against Medical Advice | Payer: 59

## 2021-06-14 ENCOUNTER — Encounter (HOSPITAL_COMMUNITY): Payer: Self-pay | Admitting: Emergency Medicine

## 2021-06-14 ENCOUNTER — Other Ambulatory Visit: Payer: Self-pay

## 2021-06-14 DIAGNOSIS — K769 Liver disease, unspecified: Secondary | ICD-10-CM

## 2021-06-14 DIAGNOSIS — Z8249 Family history of ischemic heart disease and other diseases of the circulatory system: Secondary | ICD-10-CM | POA: Diagnosis not present

## 2021-06-14 DIAGNOSIS — K7689 Other specified diseases of liver: Secondary | ICD-10-CM | POA: Diagnosis not present

## 2021-06-14 DIAGNOSIS — D72829 Elevated white blood cell count, unspecified: Secondary | ICD-10-CM | POA: Insufficient documentation

## 2021-06-14 DIAGNOSIS — R110 Nausea: Secondary | ICD-10-CM | POA: Insufficient documentation

## 2021-06-14 DIAGNOSIS — K353 Acute appendicitis with localized peritonitis, without perforation or gangrene: Secondary | ICD-10-CM | POA: Diagnosis not present

## 2021-06-14 DIAGNOSIS — Z20822 Contact with and (suspected) exposure to covid-19: Secondary | ICD-10-CM | POA: Insufficient documentation

## 2021-06-14 DIAGNOSIS — R109 Unspecified abdominal pain: Secondary | ICD-10-CM | POA: Diagnosis not present

## 2021-06-14 DIAGNOSIS — R1031 Right lower quadrant pain: Secondary | ICD-10-CM | POA: Diagnosis present

## 2021-06-14 LAB — COMPREHENSIVE METABOLIC PANEL
ALT: 22 U/L (ref 0–44)
AST: 17 U/L (ref 15–41)
Albumin: 4.5 g/dL (ref 3.5–5.0)
Alkaline Phosphatase: 57 U/L (ref 38–126)
Anion gap: 9 (ref 5–15)
BUN: 13 mg/dL (ref 6–20)
CO2: 26 mmol/L (ref 22–32)
Calcium: 9.4 mg/dL (ref 8.9–10.3)
Chloride: 103 mmol/L (ref 98–111)
Creatinine, Ser: 1.35 mg/dL — ABNORMAL HIGH (ref 0.61–1.24)
GFR, Estimated: >60 mL/min (ref >60)
Glucose, Bld: 69 mg/dL — ABNORMAL LOW (ref 70–99)
Potassium: 3.5 mmol/L (ref 3.5–5.1)
Sodium: 138 mmol/L (ref 135–145)
Total Bilirubin: 0.7 mg/dL (ref 0.3–1.2)
Total Protein: 7.1 g/dL (ref 6.5–8.1)

## 2021-06-14 LAB — CBC WITH DIFFERENTIAL/PLATELET
Abs Immature Granulocytes: 0.05 10*3/uL (ref 0.00–0.07)
Basophils Absolute: 0.1 10*3/uL (ref 0.0–0.1)
Basophils Relative: 1 %
Eosinophils Absolute: 0.1 10*3/uL (ref 0.0–0.5)
Eosinophils Relative: 1 %
HCT: 46.1 % (ref 39.0–52.0)
Hemoglobin: 15.4 g/dL (ref 13.0–17.0)
Immature Granulocytes: 0 %
Lymphocytes Relative: 28 %
Lymphs Abs: 3.9 10*3/uL (ref 0.7–4.0)
MCH: 29.6 pg (ref 26.0–34.0)
MCHC: 33.4 g/dL (ref 30.0–36.0)
MCV: 88.7 fL (ref 80.0–100.0)
Monocytes Absolute: 1.3 10*3/uL — ABNORMAL HIGH (ref 0.1–1.0)
Monocytes Relative: 10 %
Neutro Abs: 8.4 10*3/uL — ABNORMAL HIGH (ref 1.7–7.7)
Neutrophils Relative %: 60 %
Platelets: 236 10*3/uL (ref 150–400)
RBC: 5.2 MIL/uL (ref 4.22–5.81)
RDW: 13 % (ref 11.5–15.5)
WBC: 13.8 10*3/uL — ABNORMAL HIGH (ref 4.0–10.5)
nRBC: 0 % (ref 0.0–0.2)

## 2021-06-14 LAB — URINALYSIS, ROUTINE W REFLEX MICROSCOPIC
Bilirubin Urine: NEGATIVE
Glucose, UA: NEGATIVE mg/dL
Hgb urine dipstick: NEGATIVE
Ketones, ur: NEGATIVE mg/dL
Leukocytes,Ua: NEGATIVE
Nitrite: NEGATIVE
Protein, ur: NEGATIVE mg/dL
Specific Gravity, Urine: 1.029 (ref 1.005–1.030)
pH: 6 (ref 5.0–8.0)

## 2021-06-14 LAB — LIPASE, BLOOD: Lipase: 28 U/L (ref 11–51)

## 2021-06-14 NOTE — ED Provider Notes (Signed)
Emergency Medicine Provider Triage Evaluation Note  Jacob Jennings , a 25 y.o. male  was evaluated in triage.  Pt complains of right lower quadrant pain which started at 30 this morning.  He had significant chills but did not measure his temperature.  He has had 1 episode of nonbloody nonbilious emesis.  Has not had very good p.o. intake today.  He does have his appendix.  Denies any urinary symptoms.  No back pain.  Review of Systems  Positive: As above Negative: As above  Physical Exam  BP 133/87 (BP Location: Left Arm)   Pulse (!) 104   Temp 99.7 F (37.6 C)   Resp 18   Ht 5\' 11"  (1.803 m)   Wt 88.5 kg   SpO2 99%   BMI 27.20 kg/m  Gen:   Awake, no distress   Resp:  Normal effort  MSK:   Moves extremities without difficulty  Other: Positive McBurney sign with significant right lower tenderness no CVA tenderness.  Negative Murphy's  Medical Decision Making  Medically screening exam initiated at 8:04 PM.  Appropriate orders placed.  was informed that the remainder of the evaluation will be completed by another provider, this initial triage assessment does not replace that evaluation, and the importance of remaining in the ED until their evaluation is complete.     [name] 06/14/21 06/16/21, MD 06/14/21 [phone]

## 2021-06-14 NOTE — ED Triage Notes (Signed)
RLQ abdominal pain, waking him up at 2:30am.  No backpain, urinary symptoms, emesis X1.

## 2021-06-15 ENCOUNTER — Encounter (HOSPITAL_COMMUNITY): Payer: Self-pay

## 2021-06-15 ENCOUNTER — Emergency Department (HOSPITAL_COMMUNITY): Payer: 59

## 2021-06-15 ENCOUNTER — Emergency Department (HOSPITAL_COMMUNITY): Payer: 59 | Admitting: Certified Registered Nurse Anesthetist

## 2021-06-15 ENCOUNTER — Encounter (HOSPITAL_COMMUNITY)
Admission: EM | Disposition: A | Discharge status: Home / Self Care | Payer: Self-pay | Source: Home / Self Care | Attending: Emergency Medicine

## 2021-06-15 DIAGNOSIS — K420 Umbilical hernia with obstruction, without gangrene: Secondary | ICD-10-CM | POA: Diagnosis not present

## 2021-06-15 DIAGNOSIS — R109 Unspecified abdominal pain: Secondary | ICD-10-CM | POA: Diagnosis not present

## 2021-06-15 DIAGNOSIS — R110 Nausea: Secondary | ICD-10-CM | POA: Diagnosis not present

## 2021-06-15 DIAGNOSIS — D72829 Elevated white blood cell count, unspecified: Secondary | ICD-10-CM | POA: Diagnosis not present

## 2021-06-15 DIAGNOSIS — K37 Unspecified appendicitis: Secondary | ICD-10-CM | POA: Diagnosis not present

## 2021-06-15 DIAGNOSIS — Z8249 Family history of ischemic heart disease and other diseases of the circulatory system: Secondary | ICD-10-CM | POA: Diagnosis not present

## 2021-06-15 DIAGNOSIS — K3580 Unspecified acute appendicitis: Secondary | ICD-10-CM | POA: Diagnosis not present

## 2021-06-15 DIAGNOSIS — K353 Acute appendicitis with localized peritonitis, without perforation or gangrene: Secondary | ICD-10-CM | POA: Diagnosis not present

## 2021-06-15 DIAGNOSIS — K7689 Other specified diseases of liver: Secondary | ICD-10-CM | POA: Diagnosis not present

## 2021-06-15 DIAGNOSIS — Z20822 Contact with and (suspected) exposure to covid-19: Secondary | ICD-10-CM | POA: Diagnosis not present

## 2021-06-15 HISTORY — PX: LAPAROSCOPIC APPENDECTOMY: SHX408

## 2021-06-15 LAB — RESP PANEL BY RT-PCR (FLU A&B, COVID) ARPGX2
Influenza A by PCR: NEGATIVE
Influenza B by PCR: NEGATIVE
SARS Coronavirus 2 by RT PCR: NEGATIVE

## 2021-06-15 SURGERY — APPENDECTOMY, LAPAROSCOPIC
Anesthesia: General

## 2021-06-15 MED ORDER — FENTANYL CITRATE (PF) 250 MCG/5ML IJ SOLN
INTRAMUSCULAR | Status: AC
  Filled 2021-06-15: qty 5

## 2021-06-15 MED ORDER — HYDROMORPHONE HCL 1 MG/ML IJ SOLN
INTRAMUSCULAR | Status: AC
  Filled 2021-06-15: qty 1

## 2021-06-15 MED ORDER — OXYCODONE HCL 5 MG PO TABS
5.0000 mg | ORAL_TABLET | Freq: Once | ORAL | Status: AC | PRN
  Administered 2021-06-15: 5 mg via ORAL

## 2021-06-15 MED ORDER — BUPIVACAINE-EPINEPHRINE (PF) 0.25% -1:200000 IJ SOLN
INTRAMUSCULAR | Status: AC
  Filled 2021-06-15: qty 30

## 2021-06-15 MED ORDER — OXYCODONE HCL 5 MG PO TABS
ORAL_TABLET | ORAL | Status: AC
  Filled 2021-06-15: qty 1

## 2021-06-15 MED ORDER — FENTANYL CITRATE (PF) 250 MCG/5ML IJ SOLN
INTRAMUSCULAR | Status: DC | PRN
  Administered 2021-06-15 (×3): 50 ug via INTRAVENOUS

## 2021-06-15 MED ORDER — PROPOFOL 10 MG/ML IV BOLUS
INTRAVENOUS | Status: AC
  Filled 2021-06-15: qty 20

## 2021-06-15 MED ORDER — SODIUM CHLORIDE 0.9 % IV SOLN
2.0000 g | Freq: Once | INTRAVENOUS | Status: AC
  Administered 2021-06-15: 2 g via INTRAVENOUS
  Filled 2021-06-15: qty 20

## 2021-06-15 MED ORDER — LIDOCAINE 2% (20 MG/ML) 5 ML SYRINGE
INTRAMUSCULAR | Status: DC | PRN
  Administered 2021-06-15: 20 mg via INTRAVENOUS

## 2021-06-15 MED ORDER — PROMETHAZINE HCL 25 MG/ML IJ SOLN: 6.2500 mg | INTRAMUSCULAR | Status: DC | PRN

## 2021-06-15 MED ORDER — MEPERIDINE HCL 25 MG/ML IJ SOLN: 6.2500 mg | INTRAMUSCULAR | Status: DC | PRN

## 2021-06-15 MED ORDER — BUPIVACAINE-EPINEPHRINE 0.25% -1:200000 IJ SOLN
INTRAMUSCULAR | Status: DC | PRN
  Administered 2021-06-15: 30 mL

## 2021-06-15 MED ORDER — DEXAMETHASONE SODIUM PHOSPHATE 10 MG/ML IJ SOLN
INTRAMUSCULAR | Status: DC | PRN
Start: 2021-06-15 — End: 2021-06-15
  Administered 2021-06-15: 10 mg via INTRAVENOUS

## 2021-06-15 MED ORDER — ACETAMINOPHEN 10 MG/ML IV SOLN
INTRAVENOUS | Status: DC | PRN
  Administered 2021-06-15: 1000 mg via INTRAVENOUS

## 2021-06-15 MED ORDER — MIDAZOLAM HCL 2 MG/2ML IJ SOLN
INTRAMUSCULAR | Status: DC | PRN
  Administered 2021-06-15: 2 mg via INTRAVENOUS

## 2021-06-15 MED ORDER — HYDROMORPHONE HCL 1 MG/ML IJ SOLN
0.2500 mg | INTRAMUSCULAR | Status: DC | PRN
  Administered 2021-06-15 (×3): 0.5 mg via INTRAVENOUS

## 2021-06-15 MED ORDER — IOHEXOL 350 MG/ML SOLN
75.0000 mL | Freq: Once | INTRAVENOUS | Status: AC | PRN
  Administered 2021-06-15: 75 mL via INTRAVENOUS

## 2021-06-15 MED ORDER — LACTATED RINGERS IV SOLN: INTRAVENOUS | Status: DC

## 2021-06-15 MED ORDER — CHLORHEXIDINE GLUCONATE 0.12 % MT SOLN: 15.0000 mL | Freq: Once | OROMUCOSAL | Status: AC

## 2021-06-15 MED ORDER — CHLORHEXIDINE GLUCONATE 0.12 % MT SOLN
OROMUCOSAL | Status: AC
  Administered 2021-06-15: 15 mL via OROMUCOSAL
  Filled 2021-06-15: qty 15

## 2021-06-15 MED ORDER — ORAL CARE MOUTH RINSE: 15.0000 mL | Freq: Once | OROMUCOSAL | Status: AC

## 2021-06-15 MED ORDER — MIDAZOLAM HCL 2 MG/2ML IJ SOLN: 0.5000 mg | Freq: Once | INTRAMUSCULAR | Status: DC | PRN

## 2021-06-15 MED ORDER — SUGAMMADEX SODIUM 200 MG/2ML IV SOLN
INTRAVENOUS | Status: DC | PRN
  Administered 2021-06-15: 200 mg via INTRAVENOUS

## 2021-06-15 MED ORDER — METHOCARBAMOL 750 MG PO TABS: 750.0000 mg | ORAL_TABLET | Freq: Four times a day (QID) | ORAL | 1 refills | Status: DC

## 2021-06-15 MED ORDER — MIDAZOLAM HCL 2 MG/2ML IJ SOLN
INTRAMUSCULAR | Status: AC
  Filled 2021-06-15: qty 2

## 2021-06-15 MED ORDER — OXYCODONE HCL 5 MG/5ML PO SOLN
5.0000 mg | Freq: Once | ORAL | Status: AC | PRN
Start: 2021-06-15 — End: 2021-06-15

## 2021-06-15 MED ORDER — DEXMEDETOMIDINE (PRECEDEX) IN NS 20 MCG/5ML (4 MCG/ML) IV SYRINGE
PREFILLED_SYRINGE | INTRAVENOUS | Status: DC | PRN
  Administered 2021-06-15 (×2): 8 ug via INTRAVENOUS
  Administered 2021-06-15: 4 ug via INTRAVENOUS

## 2021-06-15 MED ORDER — IBUPROFEN 600 MG PO TABS: 600.0000 mg | ORAL_TABLET | Freq: Four times a day (QID) | ORAL | 1 refills | Status: DC

## 2021-06-15 MED ORDER — ACETAMINOPHEN 10 MG/ML IV SOLN
INTRAVENOUS | Status: AC
  Filled 2021-06-15: qty 100

## 2021-06-15 MED ORDER — OXYCODONE HCL 5 MG PO TABS: 5.0000 mg | ORAL_TABLET | ORAL | 0 refills | Status: DC | PRN

## 2021-06-15 MED ORDER — ROCURONIUM BROMIDE 10 MG/ML (PF) SYRINGE
PREFILLED_SYRINGE | INTRAVENOUS | Status: DC | PRN
  Administered 2021-06-15: 50 mg via INTRAVENOUS

## 2021-06-15 MED ORDER — METRONIDAZOLE 500 MG/100ML IV SOLN
500.0000 mg | Freq: Once | INTRAVENOUS | Status: AC
  Administered 2021-06-15: 500 mg via INTRAVENOUS
  Filled 2021-06-15: qty 100

## 2021-06-15 MED ORDER — ONDANSETRON HCL 4 MG/2ML IJ SOLN
INTRAMUSCULAR | Status: DC | PRN
  Administered 2021-06-15: 4 mg via INTRAVENOUS

## 2021-06-15 MED ORDER — DOCUSATE SODIUM 100 MG PO CAPS: 100.0000 mg | ORAL_CAPSULE | Freq: Two times a day (BID) | ORAL | 2 refills | Status: DC

## 2021-06-15 MED ORDER — 0.9 % SODIUM CHLORIDE (POUR BTL) OPTIME
TOPICAL | Status: DC | PRN
  Administered 2021-06-15: 1000 mL

## 2021-06-15 MED ORDER — PROPOFOL 10 MG/ML IV BOLUS
INTRAVENOUS | Status: DC | PRN
  Administered 2021-06-15: 200 mg via INTRAVENOUS

## 2021-06-15 SURGICAL SUPPLY — 58 items
ADH SKN CLS APL DERMABOND .7 (GAUZE/BANDAGES/DRESSINGS) ×1
ADH SKN CLS LQ APL DERMABOND (GAUZE/BANDAGES/DRESSINGS) ×1
APL PRP STRL LF DISP 70% ISPRP (MISCELLANEOUS) ×1
APPLIER CLIP ROT 10 11.4 M/L (STAPLE) ×2
APR CLP MED LRG 11.4X10 (STAPLE) ×1
BAG COUNTER SPONGE SURGICOUNT (BAG) ×2 IMPLANT
BAG SPEC RTRVL 10 TROC 200 (ENDOMECHANICALS) ×1
BAG SPEC RTRVL LRG 6X4 10 (ENDOMECHANICALS)
BAG SPNG CNTER NS LX DISP (BAG) ×1
BLADE CLIPPER SURG (BLADE) ×1 IMPLANT
CANISTER SUCT 3000ML PPV (MISCELLANEOUS) IMPLANT
CHLORAPREP W/TINT 26 (MISCELLANEOUS) ×2 IMPLANT
CLIP APPLIE ROT 10 11.4 M/L (STAPLE) IMPLANT
COVER SURGICAL LIGHT HANDLE (MISCELLANEOUS) ×2 IMPLANT
CUTTER FLEX LINEAR 45M (STAPLE) ×2 IMPLANT
DERMABOND ADHESIVE PROPEN (GAUZE/BANDAGES/DRESSINGS) ×1
DERMABOND ADVANCED (GAUZE/BANDAGES/DRESSINGS) ×1
DERMABOND ADVANCED .7 DNX12 (GAUZE/BANDAGES/DRESSINGS) IMPLANT
DERMABOND ADVANCED .7 DNX6 (GAUZE/BANDAGES/DRESSINGS) ×1 IMPLANT
ELECT CAUTERY BLADE 6.4 (BLADE) ×2 IMPLANT
ELECT REM PT RETURN 9FT ADLT (ELECTROSURGICAL) ×2
ELECTRODE REM PT RTRN 9FT ADLT (ELECTROSURGICAL) ×1 IMPLANT
ENDOLOOP SUT PDS II  0 18 (SUTURE)
ENDOLOOP SUT PDS II 0 18 (SUTURE) IMPLANT
GLOVE SURG ENC MOIS LTX SZ6.5 (GLOVE) ×2 IMPLANT
GLOVE SURG UNDER POLY LF SZ6 (GLOVE) ×2 IMPLANT
GOWN STRL REUS W/ TWL LRG LVL3 (GOWN DISPOSABLE) ×3 IMPLANT
GOWN STRL REUS W/TWL LRG LVL3 (GOWN DISPOSABLE) ×6
HEMOSTAT SURGICEL 2X14 (HEMOSTASIS) ×1 IMPLANT
KIT BASIN OR (CUSTOM PROCEDURE TRAY) ×2 IMPLANT
KIT TURNOVER KIT B (KITS) ×2 IMPLANT
NDL INSUFFLATION 14GA 120MM (NEEDLE) IMPLANT
NEEDLE INSUFFLATION 14GA 120MM (NEEDLE) IMPLANT
NS IRRIG 1000ML POUR BTL (IV SOLUTION) ×2 IMPLANT
PAD ARMBOARD 7.5X6 YLW CONV (MISCELLANEOUS) ×4 IMPLANT
PENCIL BUTTON HOLSTER BLD 10FT (ELECTRODE) ×2 IMPLANT
POUCH RETRIEVAL ECOSAC 10 (ENDOMECHANICALS) IMPLANT
POUCH RETRIEVAL ECOSAC 10MM (ENDOMECHANICALS) ×2
POUCH SPECIMEN RETRIEVAL 10MM (ENDOMECHANICALS) ×1 IMPLANT
RELOAD 45 VASCULAR/THIN (ENDOMECHANICALS) ×4 IMPLANT
RELOAD STAPLE 45 2.5 WHT GRN (ENDOMECHANICALS) IMPLANT
RELOAD STAPLE 45 3.5 BLU ETS (ENDOMECHANICALS) IMPLANT
RELOAD STAPLE TA45 3.5 REG BLU (ENDOMECHANICALS) ×2 IMPLANT
SCISSORS LAP 5X35 DISP (ENDOMECHANICALS) IMPLANT
SET IRRIG TUBING LAPAROSCOPIC (IRRIGATION / IRRIGATOR) ×1 IMPLANT
SET TUBE SMOKE EVAC HIGH FLOW (TUBING) ×2 IMPLANT
SHEARS HARMONIC ACE PLUS 36CM (ENDOMECHANICALS) ×1 IMPLANT
SLEEVE ENDOPATH XCEL 5M (ENDOMECHANICALS) ×2 IMPLANT
SPECIMEN JAR SMALL (MISCELLANEOUS) ×2 IMPLANT
SPONGE T-LAP 4X18 ~~LOC~~+RFID (SPONGE) ×1 IMPLANT
SUT MNCRL AB 4-0 PS2 18 (SUTURE) ×2 IMPLANT
SUT VICRYL 0 UR6 27IN ABS (SUTURE) IMPLANT
TOWEL GREEN STERILE (TOWEL DISPOSABLE) ×2 IMPLANT
TOWEL GREEN STERILE FF (TOWEL DISPOSABLE) ×2 IMPLANT
TRAY LAPAROSCOPIC MC (CUSTOM PROCEDURE TRAY) ×2 IMPLANT
TROCAR XCEL BLUNT TIP 100MML (ENDOMECHANICALS) ×1 IMPLANT
TROCAR XCEL NON-BLD 5MMX100MML (ENDOMECHANICALS) ×2 IMPLANT
WATER STERILE IRR 1000ML POUR (IV SOLUTION) ×2 IMPLANT

## 2021-06-15 NOTE — H&P (Signed)
Reason for Consult/Chief Complaint: acute appendicitis Consultant: Long, MD  Jacob Jennings is an 25 y.o. male.   HPI: 33M with RLQ pain that awakened him from sleep at 0230 on 06/14/2021. Associated nausea, chills, fever, no vomiting. Last BM 9/23 AM, normal quality. Last meal 1500 on 9/23.  Past Medical History:  Diagnosis Date   Anxiety    Depression     Past Surgical History:  Procedure Laterality Date   oral lesion biopsy  2016   WISDOM TOOTH EXTRACTION      Family History  Problem Relation Age of Onset   Diabetes Mother    Arthritis Father    Hearing loss Father    Hypertension Father    Drug abuse Brother    Heart disease Maternal Grandmother    Early death Maternal Grandfather 45   Heart disease Maternal Grandfather    Diabetes Maternal Grandfather    Cancer Paternal Grandmother        thyroid    Social History:  reports that he has never smoked. He has never used smokeless tobacco. He reports that he does not drink alcohol and does not use drugs.  Allergies: No Known Allergies  Medications: I have reviewed the patient's current medications.  Results for orders placed or performed during the hospital encounter of 06/14/21 (from the past 48 hour(s))  Urinalysis, Routine w reflex microscopic Urine, Clean Catch     Status: None   Collection Time: 06/14/21  8:07 PM  Result Value Ref Range   Color, Urine YELLOW YELLOW   APPearance CLEAR CLEAR   Specific Gravity, Urine 1.029 1.005 - 1.030   pH 6.0 5.0 - 8.0   Glucose, UA NEGATIVE NEGATIVE mg/dL   Hgb urine dipstick NEGATIVE NEGATIVE   Bilirubin Urine NEGATIVE NEGATIVE   Ketones, ur NEGATIVE NEGATIVE mg/dL   Protein, ur NEGATIVE NEGATIVE mg/dL   Nitrite NEGATIVE NEGATIVE   Leukocytes,Ua NEGATIVE NEGATIVE    Comment: Performed at [facility], [address], [place], [place] [address]  CBC with Differential     Status: Abnormal   Collection Time: 06/14/21  8:08 PM  Result Value Ref Range   WBC  13.8 (H) 4.0 - 10.5 K/uL   RBC 5.20 4.22 - 5.81 MIL/uL   Hemoglobin 15.4 13.0 - 17.0 g/dL   HCT 87.5 64.3 - 32.9 %   MCV 88.7 80.0 - 100.0 fL   MCH 29.6 26.0 - 34.0 pg   MCHC 33.4 30.0 - 36.0 g/dL   RDW 51.8 84.1 - 66.0 %   Platelets 236 150 - 400 K/uL   nRBC 0.0 0.0 - 0.2 %   Neutrophils Relative % 60 %   Neutro Abs 8.4 (H) 1.7 - 7.7 K/uL   Lymphocytes Relative 28 %   Lymphs Abs 3.9 0.7 - 4.0 K/uL   Monocytes Relative 10 %   Monocytes Absolute 1.3 (H) 0.1 - 1.0 K/uL   Eosinophils Relative 1 %   Eosinophils Absolute 0.1 0.0 - 0.5 K/uL   Basophils Relative 1 %   Basophils Absolute 0.1 0.0 - 0.1 K/uL   Immature Granulocytes 0 %   Abs Immature Granulocytes 0.05 0.00 - 0.07 K/uL    Comment: Performed at [facility], [address] [place], [place] [address]  Comprehensive metabolic panel     Status: Abnormal   Collection Time: 06/14/21  8:08 PM  Result Value Ref Range   Sodium 138 135 - 145 mmol/L   Potassium 3.5  3.5 - 5.1 mmol/L   Chloride 103 98 - 111 mmol/L   CO2 26 22 - 32 mmol/L   Glucose, Bld 69 (L) 70 - 99 mg/dL    Comment: Glucose reference range applies only to samples taken after fasting for at least 8 hours.   BUN 13 6 - 20 mg/dL   Creatinine, Ser 0.86 (H) 0.61 - 1.24 mg/dL   Calcium 9.4 8.9 - 76.1 mg/dL   Total Protein 7.1 6.5 - 8.1 g/dL   Albumin 4.5 3.5 - 5.0 g/dL   AST 17 15 - 41 U/L   ALT 22 0 - 44 U/L   Alkaline Phosphatase 57 38 - 126 U/L   Total Bilirubin 0.7 0.3 - 1.2 mg/dL   GFR, Estimated >95 >09 mL/min    Comment: (NOTE) Calculated using the CKD-EPI Creatinine Equation (2021)    Anion gap 9 5 - 15    Comment: Performed at [facility], [address], [place], [place] [address]  Lipase, blood     Status: None   Collection Time: 06/14/21  8:08 PM  Result Value Ref Range   Lipase 28 11 - 51 U/L    Comment: Performed at [facility], [address] [place], [place] [address]  Resp Panel by RT-PCR (Flu A&B, Covid) Nasopharyngeal  Swab     Status: None   Collection Time: 06/15/21  4:31 AM   Specimen: Nasopharyngeal Swab; Nasopharyngeal(NP) swabs in vial transport medium  Result Value Ref Range   SARS Coronavirus 2 by RT PCR NEGATIVE NEGATIVE    Comment: (NOTE) SARS-CoV-2 target nucleic acids are NOT DETECTED.  The SARS-CoV-2 RNA is generally detectable in upper respiratory specimens during the acute phase of infection. The lowest concentration of SARS-CoV-2 viral copies this assay can detect is 138 copies/mL. A negative result does not preclude SARS-Cov-2 infection and should not be used as the sole basis for treatment or other patient management decisions. A negative result may occur with  improper specimen collection/handling, submission of specimen other than nasopharyngeal swab, presence of viral mutation(s) within the areas targeted by this assay, and inadequate number of viral copies(<138 copies/mL). A negative result must be combined with clinical observations, patient history, and epidemiological information. The expected result is Negative.  Fact Sheet for Patients:  BloggerCourse.com  Fact Sheet for Healthcare Providers:  SeriousBroker.it  This test is no t yet approved or cleared by the [place] FDA and  has been authorized for detection and/or diagnosis of SARS-CoV-2 by FDA under an Emergency Use Authorization (EUA). This EUA will remain  in effect (meaning this test can be used) for the duration of the COVID-19 declaration under Section 564(b)(1) of the Act, 21 U.S.C.section 360bbb-3(b)(1), unless the authorization is terminated  or revoked sooner.       Influenza A by PCR NEGATIVE NEGATIVE   Influenza B by PCR NEGATIVE NEGATIVE    Comment: (NOTE) The Xpert Xpress SARS-CoV-2/FLU/RSV plus assay is intended as an aid in the diagnosis of influenza from Nasopharyngeal swab specimens and should not be used as a sole basis for treatment.  Nasal washings and aspirates are unacceptable for Xpert Xpress SARS-CoV-2/FLU/RSV testing.  Fact Sheet for Patients: BloggerCourse.com  Fact Sheet for Healthcare Providers: SeriousBroker.it  This test is not yet approved or cleared by the [place] FDA and has been authorized for detection and/or diagnosis of SARS-CoV-2 by FDA under an Emergency Use Authorization (EUA). This EUA will remain in effect (meaning this test can  be used) for the duration of the COVID-19 declaration under Section 564(b)(1) of the Act, 21 U.S.C. section 360bbb-3(b)(1), unless the authorization is terminated or revoked.  Performed at [facility], [address] [place], [place] [address]     CT ABDOMEN PELVIS W CONTRAST  Result Date: 06/15/2021 CLINICAL DATA:  25 year old male with right lower quadrant abdominal pain. EXAM: CT ABDOMEN AND PELVIS WITH CONTRAST TECHNIQUE: Multidetector CT imaging of the abdomen and pelvis was performed using the standard protocol following bolus administration of intravenous contrast. CONTRAST:  57mL OMNIPAQUE IOHEXOL 350 MG/ML SOLN COMPARISON:  None. FINDINGS: Lower chest: Negative. Hepatobiliary: Lobulated hypodense, nearly isodense roughly 4 cm parenchymal lesion of the right subcapsular hepatic lobe. See series 3, image 20 and series 6, image 32. Elsewhere liver enhancement is normal. Negative gallbladder and bile ducts. Pancreas: Negative. Spleen: Negative. Adrenals/Urinary Tract: Negative. Stomach/Bowel: Negative large bowel from the ascending colon distally. Retrocecal appendix is enlarged and indistinct (series 3, image 54 and coronal image 47) with inflammatory stranding at the tip of the appendix (coronal image 51). Appendix: Location: Retrocecal Diameter: 9-10 mm Appendicolith: Not identified Mucosal hyper-enhancement: Mild Extraluminal gas: Negative Periappendiceal collection: Negative, regional inflammatory  stranding. Terminal ileum is mildly dilated but otherwise appears normal. Upstream small bowel is decompressed. Negative stomach and duodenum. No free air or free fluid. Vascular/Lymphatic: Major arterial structures in the abdomen and pelvis appear patent and normal. Portal venous system is patent. No lymphadenopathy. Reproductive: Negative. Other: No pelvic free fluid. Musculoskeletal: Negative. IMPRESSION: 1. Positive for Acute Appendicitis. No evidence of perforation or abscess. 2. Indeterminate but probably benign 4 cm liver lesion in the periphery of the right hepatic lobe. This meets consensus criteria for routine follow-up Abdomen MRI (without and with contrast, liver protocol) to further characterize. This recommendation follows ACR consensus guidelines: Management of Incidental Liver Lesions on CT: A White Paper of the ACR Incidental Findings Committee. J Am Coll Radiol 2017; 62:1308-6578. Electronically Signed   By: Odessa Fleming M.D.   On: 06/15/2021 04:26    ROS 10 point review of systems is negative except as listed above in HPI.   Physical Exam Blood pressure 109/67, pulse 89, temperature 99.7 F (37.6 C), resp. rate 12, height 5\' 11"  (1.803 m), weight 88.5 kg, SpO2 96 %. Constitutional: well-developed, well-nourished HEENT: pupils equal, round, reactive to light, 69mm b/l, moist conjunctiva, external inspection of ears and nose normal, hearing intact Oropharynx: normal oropharyngeal mucosa, normal dentition Neck: no thyromegaly, trachea midline, no midline cervical tenderness to palpation Chest: breath sounds equal bilaterally, normal respiratory effort, no midline or lateral chest wall tenderness to palpation/deformity Abdomen: soft, mild RLQ TTP, no bruising, no hepatosplenomegaly GU: no blood at urethral meatus of penis, no scrotal masses or abnormality  Back: no wounds, no thoracic/lumbar spine tenderness to palpation, no thoracic/lumbar spine stepoffs Rectal: good tone, no  blood Extremities: 2+ radial and pedal pulses bilaterally, intact motor and sensation bilateral UE and LE, no peripheral edema MSK: normal gait/station, no clubbing/cyanosis of fingers/toes, normal ROM of all four extremities Skin: warm, dry, no rashes Psych: normal memory, normal mood/affect    Assessment/Plan: 85M with acute appendicitis. Informed consent was obtained after detailed explanation of risks, including bleeding, infection, abscess, staple line leak, stump appendicitis, injury to surrounding structures, and need for conversion to open procedure. All questions answered to the patient's satisfaction. Mother present at bedside, all questions answered.    3m, MD General and Trauma Surgery [facility]

## 2021-06-15 NOTE — Anesthesia Postprocedure Evaluation (Signed)
Anesthesia Post Note  Patient: Jacob Jennings  Procedure(s) Performed: APPENDECTOMY LAPAROSCOPIC     Patient location during evaluation: PACU Anesthesia Type: General Level of consciousness: awake and alert, patient cooperative and oriented Pain management: pain level controlled Vital Signs Assessment: post-procedure vital signs reviewed and stable Respiratory status: spontaneous breathing, nonlabored ventilation and respiratory function stable Cardiovascular status: blood pressure returned to baseline and stable Postop Assessment: no apparent nausea or vomiting and able to ambulate Anesthetic complications: no   No notable events documented.  Last Vitals:  Vitals:   06/15/21 1130 06/15/21 1143  BP: 133/83 (!) 138/93  Pulse: 88 77  Resp: 17 12  Temp:  (!) 36.4 C  SpO2: 95% 93%    Last Pain:  Vitals:   06/15/21 1143  TempSrc:   PainSc: 2                  Kache Mcclurg,E. Earlin Sweeden

## 2021-06-15 NOTE — Discharge Instructions (Addendum)
May shower beginning 06/16/2021. Do not peel off or scrub skin glue. May allow warm soapy water to run over incision, then rinse and pat dry. Do not soak in any water (tubs, hot tubs, pools, lakes, oceans) for one week.   No lifting greater than 5 pounds for six weeks.   Pain regimen: take over-the-counter tylenol (acetaminophen) 1000mg  every six hours, the prescription ibuprofen (600mg ) every six hours and the robaxin (methocarbamol) 750mg  every six hours. With all three of these, you should be taking something every two hours. Example: tylenol ( acetaminophen) at 8am, ibuprofen at 10am, robaxin (methocarbamol) at 12pm, tylenol (acetaminophen) again at 2pm, ibuprofen again at 4pm, robaxin (methocarbamol) at 6pm. You also have a prescription for oxycodone, which should be taken if the tylenol (acetaminophen), ibuprofen, and robaxin (methocarbamol) are not enough to control your pain. You may take the oxycodone as frequently as every four hours as needed, but if you are taking the other medications as above, you should not need the oxycodone this frequently. You have also been given a prescription for colace (docusate) which is a stool softener. Please take this as prescribed because the oxycodone can cause constipation and the colace (docusate) will minimize or prevent constipation.  Call the office at [phone] for temperature greater than 101.36F, worsening pain, redness or warmth at the incision site.  Please call [phone] to make an appointment for 2-3 weeks after surgery for wound check.    [facility]  Please arrive at least 30 min before your appointment to complete your check in paperwork.  If you are unable to arrive 30 min prior to your appointment time we may have to cancel or reschedule you. LAPAROSCOPIC SURGERY: POST OP INSTRUCTIONS Always review your discharge instruction sheet given to you by the facility where your surgery was performed. IF YOU HAVE  DISABILITY OR FAMILY LEAVE FORMS, YOU MUST BRING THEM TO THE OFFICE FOR PROCESSING.   DO NOT GIVE THEM TO YOUR DOCTOR.  PAIN CONTROL  First take acetaminophen (Tylenol) AND/or ibuprofen (Advil) to control your pain after surgery.  Follow directions on package.  Taking acetaminophen (Tylenol) and/or ibuprofen (Advil) regularly after surgery will help to control your pain and lower the amount of prescription pain medication you may need.  You should not take more than 4,000 mg (4 grams) of acetaminophen (Tylenol) in 24 hours.  You should not take ibuprofen (Advil), aleve, motrin, naprosyn or other NSAIDS if you have a history of stomach ulcers or chronic kidney disease.  A prescription for pain medication may be given to you upon discharge.  Take your pain medication as prescribed, if you still have uncontrolled pain after taking acetaminophen (Tylenol) or ibuprofen (Advil). Use ice packs to help control pain. If you need a refill on your pain medication, please contact your pharmacy.  They will contact our office to request authorization. Prescriptions will not be filled after 5pm or on week-ends.  HOME MEDICATIONS Take your usually prescribed medications unless otherwise directed.  DIET You should follow a light diet the first few days after arrival home.  Be sure to include lots of fluids daily. Avoid fatty, fried foods.   CONSTIPATION It is common to experience some constipation after surgery and if you are taking pain medication.  Increasing fluid intake and taking a stool softener (such as Colace) will usually help or prevent this problem from occurring.  A mild laxative (Milk of Magnesia or Miralax) should be taken according to package instructions if  there are no bowel movements after 48 hours.  WOUND/INCISION CARE Most patients will experience some swelling and bruising in the area of the incisions.  Ice packs will help.  Swelling and bruising can take several days to resolve.  Unless  discharge instructions indicate otherwise, follow guidelines below  STERI-STRIPS - you may remove your outer bandages 48 hours after surgery, and you may shower at that time.  You have steri-strips (small skin tapes) in place directly over the incision.  These strips should be left on the skin for 7-10 days.   DERMABOND/SKIN GLUE - you may shower in 24 hours.  The glue will flake off over the next 2-3 weeks. Any sutures or staples will be removed at the office during your follow-up visit.  ACTIVITIES You may resume regular (light) daily activities beginning the next day--such as daily self-care, walking, climbing stairs--gradually increasing activities as tolerated.  You may have sexual intercourse when it is comfortable.  Refrain from any heavy lifting or straining until approved by your doctor. You may drive when you are no longer taking prescription pain medication, you can comfortably wear a seatbelt, and you can safely maneuver your car and apply brakes.  FOLLOW-UP You should see your doctor in the office for a follow-up appointment approximately 2-3 weeks after your surgery.  You should have been given your post-op/follow-up appointment when your surgery was scheduled.  If you did not receive a post-op/follow-up appointment, make sure that you call for this appointment within a day or two after you arrive home to insure a convenient appointment time.   WHEN TO CALL YOUR DOCTOR: Fever over 101.0 Inability to urinate Continued bleeding from incision. Increased pain, redness, or drainage from the incision. Increasing abdominal pain  The clinic staff is available to answer your questions during regular business hours.  Please don't hesitate to call and ask to speak to one of the nurses for clinical concerns.  If you have a medical emergency, go to the nearest emergency room or call 911.  A surgeon from [facility] is always on call at the hospital. [address], [address], [place], [place]  [address] ? [address] [address], [place], [place]   [address] [phone] ? [phone] ? FAX [phone]

## 2021-06-15 NOTE — Anesthesia Preprocedure Evaluation (Addendum)
Anesthesia Evaluation  Patient identified by MRN, date of birth, ID band Patient awake    Reviewed: Allergy & Precautions, NPO status , Patient's Chart, lab work & pertinent test results  History of Anesthesia Complications Negative for: history of anesthetic complications  Airway Mallampati: II  TM Distance: >3 FB Neck ROM: Full    Dental  (+) Dental Advisory Given, Teeth Intact   Pulmonary neg pulmonary ROS,  06/15/2021 SARS coronavirus NEG   breath sounds clear to auscultation       Cardiovascular (-) hypertension Rhythm:Regular Rate:Normal     Neuro/Psych Anxiety Depression negative neurological ROS     GI/Hepatic Neg liver ROS, Emesis x1 with acute appy   Endo/Other  negative endocrine ROS  Renal/GU negative Renal ROS     Musculoskeletal   Abdominal   Peds  Hematology negative hematology ROS (+)   Anesthesia Other Findings   Reproductive/Obstetrics                            Anesthesia Physical Anesthesia Plan  ASA: 1  Anesthesia Plan: General   Post-op Pain Management:    Induction: Intravenous and Rapid sequence  PONV Risk Score and Plan: 2 and Ondansetron and Dexamethasone  Airway Management Planned: Oral ETT  Additional Equipment: None  Intra-op Plan:   Post-operative Plan: Extubation in OR  Informed Consent: I have reviewed the patients History and Physical, chart, labs and discussed the procedure including the risks, benefits and alternatives for the proposed anesthesia with the patient or authorized representative who has indicated his/her understanding and acceptance.     Dental advisory given  Plan Discussed with: CRNA and Surgeon  Anesthesia Plan Comments:        Anesthesia Quick Evaluation

## 2021-06-15 NOTE — Anesthesia Procedure Notes (Signed)
Procedure Name: Intubation Date/Time: 06/15/2021 9:25 AM Performed by: Clearnce Sorrel, CRNA Pre-anesthesia Checklist: Patient identified, Emergency Drugs available, Suction available and Patient being monitored Patient Re-evaluated:Patient Re-evaluated prior to induction Oxygen Delivery Method: Circle System Utilized Preoxygenation: Pre-oxygenation with 100% oxygen Induction Type: IV induction Ventilation: Mask ventilation without difficulty Laryngoscope Size: Mac, 3 and 4 Grade View: Grade I Tube type: Oral Tube size: 7.5 mm Number of attempts: 2 Airway Equipment and Method: Stylet and Oral airway Placement Confirmation: ETT inserted through vocal cords under direct vision, positive ETCO2 and breath sounds checked- equal and bilateral Secured at: 23 cm Tube secured with: Tape Dental Injury: Teeth and Oropharynx as per pre-operative assessment

## 2021-06-15 NOTE — Op Note (Signed)
   Operative Note   Date: 06/15/2021  Procedure: laparoscopic appendectomy, umbilical hernia repair  Pre-op diagnosis: acute appendicitis Post-op diagnosis: Grade 1b appendicitis, umbilical hernia  Indication and clinical history: The patient is a 25 y.o. year old male with acute appendicitis     Surgeon: Diamantina Monks, MD  Anesthesiologist: Jean Rosenthal, MD Anesthesia: General  Findings:  Specimen: appendix EBL: 15cc Drains/Implants: none  Disposition: PACU - hemodynamically stable.  Description of procedure: The patient was positioned supine on the operating room table. Time-out was performed verifying correct patient, procedure, signature of informed consent, and administration of pre-operative antibiotics. General anesthetic induction and intubation were uneventful. Foley catheter insertion was not performed as patient voided immediately prior to the procedure . The abdomen was prepped and draped in the usual sterile fashion. An umbilical incision was made using an open technique using zero vicryl stay sutures on either side of the fascia and a 73mm Hassan port inserted through a pre-existing umbilical hernia. After establishing pneumoperitoneum, which the patient tolerated well, the abdominal cavity was inspected and no injury of any intra-abdominal structures was identified. Two additional five millimeter ports were placed under direct visualization and using local anesthetic in the suprapubic and left lower quadrant regions. The patient was repositioned to Trendelenburg with the left side down. Further inspection of the right lower quadrant revealed no abscess or purulent fluid and grade 1b appendicitis. Adhesiolysis was performed for approximately 10 minutes during dissection, mobilization, and identification of the appendix. The appendix was dissected away from its mesoappendix and an endoscopic stapler used to divide the mesoappendix using a vascular load. There was bleeding from the  staple line that was controlled with the Harmonic and a piece of surgicel placed. A bowel load of the endoscopic stapler was used to staple across the appendix at its base. Both staple lines were inspected and found to be intact and without bleeding. The appendix was placed in an endoscopic specimen retrieval bag, removed via the umbilical port site, and sent to pathology as a permanent specimen. The right lower quadrant was again inspected and hemostasis confirmed. Any remaining fluid identified was suctioned. The suprapubic and left lower quadrant ports were removed under direct visualization and hemostasis confirmed. The umbilical port was removed last after desufflating the abdomen and the fascia re-approximated using the stay sutures. Additional local anesthetic was administered at the umbilical incision site. The skin of all port sites was closed with 4-0 monocryl. Sterile dressings were applied. All sponge and instrument counts were correct at the conclusion of the procedure. The patient was awakened from anesthesia, extubated uneventfully, and transported to the PACU in good condition. There were no complications.   Upon entering the abdomen (organ space), I encountered infection of the appendix .  CASE DATA:  Type of patient?: DOW CASE (Surgical Hospitalist [facility])  Status of Case? URGENT Add On  Infection Present At Time Of Surgery (PATOS)?  INFECTION of the appendix   Diamantina Monks, MD General and Trauma Surgery [facility]

## 2021-06-15 NOTE — ED Provider Notes (Signed)
Emergency Department Provider Note   I have reviewed the triage vital signs and the nursing notes.   HISTORY  Chief Complaint Abdominal Pain   HPI Jacob Jennings is a 25 y.o. male with past medical history reviewed below presents to the emergency department with right lower quadrant pain starting yesterday.  No similar pain in the past.  No prior history of abdominal surgery.  Pain is moderate to severe with some associated nausea.  No radiation of symptoms.  Denies any dysuria, testicle pain, rectal discomfort.    Past Medical History:  Diagnosis Date   Anxiety    Depression     Patient Active Problem List   Diagnosis Date Noted   Depression, recurrent (HCC) 12/07/2017   GAD (generalized anxiety disorder) 12/07/2017    Past Surgical History:  Procedure Laterality Date   oral lesion biopsy  2016   WISDOM TOOTH EXTRACTION      Allergies Patient has no known allergies.  Family History  Problem Relation Age of Onset   Diabetes Mother    Arthritis Father    Hearing loss Father    Hypertension Father    Drug abuse Brother    Heart disease Maternal Grandmother    Early death Maternal Grandfather 45   Heart disease Maternal Grandfather    Diabetes Maternal Grandfather    Cancer Paternal Grandmother        thyroid    Social History Social History   Tobacco Use   Smoking status: Never   Smokeless tobacco: Never  Vaping Use   Vaping Use: Never used  Substance Use Topics   Alcohol use: No   Drug use: No    Review of Systems  Constitutional: No fever/chills Eyes: No visual changes. ENT: No sore throat. Cardiovascular: Denies chest pain. Respiratory: Denies shortness of breath. Gastrointestinal: Positive RLQ abdominal pain. Mild nausea, no vomiting.  No diarrhea.  No constipation. Genitourinary: Negative for dysuria. Musculoskeletal: Negative for back pain. Skin: Negative for rash. Neurological: Negative for headaches, focal weakness or  numbness.  10-point ROS otherwise negative.  ____________________________________________   PHYSICAL EXAM:  VITAL SIGNS: ED Triage Vitals  Enc Vitals Group     BP 06/14/21 1945 133/87     Pulse Rate 06/14/21 1945 (!) 104     Resp 06/14/21 1945 18     Temp 06/14/21 1945 99.7 F (37.6 C)     Temp src --      SpO2 06/14/21 1945 99 %     Weight 06/14/21 2004 195 lb (88.5 kg)     Height 06/14/21 2004 5\' 11"  (1.803 m)   Constitutional: Alert and oriented. Well appearing and in no acute distress. Eyes: Conjunctivae are normal.  Head: Atraumatic. Nose: No congestion/rhinnorhea. Mouth/Throat: Mucous membranes are moist.  Neck: No stridor.   Cardiovascular: Normal rate, regular rhythm. Good peripheral circulation. Grossly normal heart sounds.   Respiratory: Normal respiratory effort.  No retractions. Lungs CTAB. Gastrointestinal: Soft with RLQ tenderness. No rebound or guarding. Remaining quadrants non-tender. No distention.  Musculoskeletal: No gross deformities of extremities. Neurologic:  Normal speech and language.  Skin:  Skin is warm, dry and intact. No rash noted.   ____________________________________________   LABS (all labs ordered are listed, but only abnormal results are displayed)  Labs Reviewed  CBC WITH DIFFERENTIAL/PLATELET - Abnormal; Notable for the following components:      Result Value   WBC 13.8 (*)    Neutro Abs 8.4 (*)    Monocytes Absolute 1.3 (*)  All other components within normal limits  COMPREHENSIVE METABOLIC PANEL - Abnormal; Notable for the following components:   Glucose, Bld 69 (*)    Creatinine, Ser 1.35 (*)    All other components within normal limits  RESP PANEL BY RT-PCR (FLU A&B, COVID) ARPGX2  LIPASE, BLOOD  URINALYSIS, ROUTINE W REFLEX MICROSCOPIC   ____________________________________________  RADIOLOGY  CT ABDOMEN PELVIS W CONTRAST  Result Date: 06/15/2021 CLINICAL DATA:  25 year old male with right lower quadrant  abdominal pain. EXAM: CT ABDOMEN AND PELVIS WITH CONTRAST TECHNIQUE: Multidetector CT imaging of the abdomen and pelvis was performed using the standard protocol following bolus administration of intravenous contrast. CONTRAST:  55mL OMNIPAQUE IOHEXOL 350 MG/ML SOLN COMPARISON:  None. FINDINGS: Lower chest: Negative. Hepatobiliary: Lobulated hypodense, nearly isodense roughly 4 cm parenchymal lesion of the right subcapsular hepatic lobe. See series 3, image 20 and series 6, image 32. Elsewhere liver enhancement is normal. Negative gallbladder and bile ducts. Pancreas: Negative. Spleen: Negative. Adrenals/Urinary Tract: Negative. Stomach/Bowel: Negative large bowel from the ascending colon distally. Retrocecal appendix is enlarged and indistinct (series 3, image 54 and coronal image 47) with inflammatory stranding at the tip of the appendix (coronal image 51). Appendix: Location: Retrocecal Diameter: 9-10 mm Appendicolith: Not identified Mucosal hyper-enhancement: Mild Extraluminal gas: Negative Periappendiceal collection: Negative, regional inflammatory stranding. Terminal ileum is mildly dilated but otherwise appears normal. Upstream small bowel is decompressed. Negative stomach and duodenum. No free air or free fluid. Vascular/Lymphatic: Major arterial structures in the abdomen and pelvis appear patent and normal. Portal venous system is patent. No lymphadenopathy. Reproductive: Negative. Other: No pelvic free fluid. Musculoskeletal: Negative. IMPRESSION: 1. Positive for Acute Appendicitis. No evidence of perforation or abscess. 2. Indeterminate but probably benign 4 cm liver lesion in the periphery of the right hepatic lobe. This meets consensus criteria for routine follow-up Abdomen MRI (without and with contrast, liver protocol) to further characterize. This recommendation follows ACR consensus guidelines: Management of Incidental Liver Lesions on CT: A White Paper of the ACR Incidental Findings Committee. J Am  Coll Radiol 2017; 62:2297-9892. Electronically Signed   By: Odessa Fleming M.D.   On: 06/15/2021 04:26    ____________________________________________   PROCEDURES  Procedure(s) performed:   Procedures  CRITICAL CARE Performed by: Maia Plan Total critical care time: 35 minutes Critical care time was exclusive of separately billable procedures and treating other patients. Critical care was necessary to treat or prevent imminent or life-threatening deterioration. Critical care was time spent personally by me on the following activities: development of treatment plan with patient and/or surrogate as well as nursing, discussions with consultants, evaluation of patient's response to treatment, examination of patient, obtaining history from patient or surrogate, ordering and performing treatments and interventions, ordering and review of laboratory studies, ordering and review of radiographic studies, pulse oximetry and re-evaluation of patient's condition.  Alona Bene, MD Emergency Medicine  ____________________________________________   INITIAL IMPRESSION / ASSESSMENT AND PLAN / ED COURSE  Pertinent labs & imaging results that were available during my care of the patient were reviewed by me and considered in my medical decision making (see chart for details).   Patient presents to the emergency department for evaluation of right lower quadrant abdominal pain.  Focal tenderness on exam. Labs from triage show leukocytosis. Normal LFTs and bilirubin.   Differential diagnosis includes but is not exclusive to acute appendicitis, renal colic, testicular torsion, urinary tract infection, prostatitis,  diverticulitis, small bowel obstruction, colitis, abdominal aortic aneurysm, gastroenteritis, constipation etc.  CT shows acute appendicitis without complication.  Discussed this with Dr. Donell Beers with general surgery who will see the patient.  Have added COVID PCR, antibiotics, IV fluids.  Updated the  patient regarding his CT findings and plan.  Patient also with a, likely benign, liver lesion on CT.  Radiology is recommending routine MRI for follow-up. Discussed this with the patient and will add as a diagnosis. He will discuss with PCP.   Discussed patient's case with Surgery, Dr. Donell Beers to request admission. Patient and family (if present) updated with plan. Care transferred to Surgery service.  I reviewed all nursing notes, vitals, pertinent old records, EKGs, labs, imaging (as available).  ____________________________________________  FINAL CLINICAL IMPRESSION(S) / ED DIAGNOSES  Final diagnoses:  Acute appendicitis with localized peritonitis, without perforation, abscess, or gangrene  Liver lesion     MEDICATIONS GIVEN DURING THIS VISIT:  Medications  cefTRIAXone (ROCEPHIN) 2 g in sodium chloride 0.9 % 100 mL IVPB (2 g Intravenous New Bag/Given 06/15/21 0450)    And  metroNIDAZOLE (FLAGYL) IVPB 500 mg (has no administration in time range)  lactated ringers infusion ( Intravenous New Bag/Given 06/15/21 0448)  iohexol (OMNIPAQUE) 350 MG/ML injection 75 mL (75 mLs Intravenous Contrast Given 06/15/21 0415)    Note:  This document was prepared using Dragon voice recognition software and may include unintentional dictation errors.  Alona Bene, MD, [facility] Emergency Medicine    Cassiel Fernandez, Arlyss Repress, MD 06/15/21 [phone]

## 2021-06-15 NOTE — Transfer of Care (Signed)
Immediate Anesthesia Transfer of Care Note  Patient: Jacob Jennings  Procedure(s) Performed: APPENDECTOMY LAPAROSCOPIC  Patient Location: PACU  Anesthesia Type:General  Level of Consciousness: awake, alert  and oriented  Airway & Oxygen Therapy: Patient Spontanous Breathing  Post-op Assessment: Report given to RN and Post -op Vital signs reviewed and stable  Post vital signs: Reviewed and stable  Last Vitals:  Vitals Value Taken Time  BP 140/82 06/15/21 1057  Temp    Pulse 98 06/15/21 1100  Resp 21 06/15/21 1100  SpO2 97 % 06/15/21 1100  Vitals shown include unvalidated device data.  Last Pain:  Vitals:   06/15/21 0829  TempSrc: Oral  PainSc: 0-No pain      Patients Stated Pain Goal: 3 (06/15/21 0829)  Complications: No notable events documented.

## 2021-06-16 ENCOUNTER — Encounter (HOSPITAL_COMMUNITY): Payer: Self-pay | Admitting: Surgery

## 2021-06-18 LAB — SURGICAL PATHOLOGY

## 2021-06-25 ENCOUNTER — Ambulatory Visit: Payer: 59 | Admitting: Family Medicine

## 2021-06-25 ENCOUNTER — Other Ambulatory Visit: Payer: Self-pay

## 2021-06-25 ENCOUNTER — Encounter: Payer: Self-pay | Admitting: Family Medicine

## 2021-06-25 VITALS — BP 132/79 | HR 88 | Temp 98.3°F | Ht 71.0 in | Wt 201.0 lb

## 2021-06-25 DIAGNOSIS — F331 Major depressive disorder, recurrent, moderate: Secondary | ICD-10-CM | POA: Diagnosis not present

## 2021-06-25 DIAGNOSIS — R160 Hepatomegaly, not elsewhere classified: Secondary | ICD-10-CM | POA: Diagnosis not present

## 2021-06-25 DIAGNOSIS — F411 Generalized anxiety disorder: Secondary | ICD-10-CM | POA: Diagnosis not present

## 2021-06-25 NOTE — Progress Notes (Signed)
Subjective:  Patient ID: Jacob Jennings, male    DOB: 03-26-1996, 25 y.o.   MRN: [id number]  Patient Care Team: Dettinger, Elige Radon, MD as PCP - General (Family Medicine)   Chief Complaint:  referral MRI   HPI: Jacob Jennings is a 25 y.o. male presenting on 06/25/2021 for referral MRI   Pt presents today for follow up after recent abnormal CT of abdomen. He had acute appendicitis and underwent appendectomy. CT revealed a lobulated hypodense 4 cm parenchymal lesion of right subcapsular hepatic lobe. Follow up MRI was recommended. He denies abdominal pain, skin color changes, abnormal bleeding or bruising, abdominal distension, excessive tylenol or alcohol intake, or family history of liver disease. Liver enzymes normal.     Relevant past medical, surgical, family, and social history reviewed and updated as indicated.  Allergies and medications reviewed and updated. Data reviewed: Chart in Epic.   Past Medical History:  Diagnosis Date   Anxiety    Depression     Past Surgical History:  Procedure Laterality Date   LAPAROSCOPIC APPENDECTOMY N/A 06/15/2021   Procedure: APPENDECTOMY LAPAROSCOPIC;  Surgeon: Diamantina Monks, MD;  Location: [facility] OR;  Service: General;  Laterality: N/A;   oral lesion biopsy  2016   WISDOM TOOTH EXTRACTION      Social History   Socioeconomic History   Marital status: Single    Spouse name: Not on file   Number of children: Not on file   Years of education: Not on file   Highest education level: Not on file  Occupational History   Not on file  Tobacco Use   Smoking status: Never   Smokeless tobacco: Never  Vaping Use   Vaping Use: Never used  Substance and Sexual Activity   Alcohol use: Yes    Comment: occ   Drug use: No   Sexual activity: Never    Birth control/protection: Abstinence  Other Topics Concern   Not on file  Social History Narrative   Holiday representative, with private contracting   Social Determinants of Health    Financial Resource Strain: Not on file  Food Insecurity: Not on file  Transportation Needs: Not on file  Physical Activity: Not on file  Stress: Not on file  Social Connections: Not on file  Intimate Partner Violence: Not on file    Outpatient Encounter Medications as of 06/25/2021  Medication Sig   buPROPion (WELLBUTRIN XL) 150 MG 24 hr tablet Take 150 mg by mouth daily.   FLUoxetine (PROZAC) 20 MG tablet Take 20 mg by mouth daily.   [DISCONTINUED] docusate sodium (COLACE) 100 MG capsule Take 1 capsule (100 mg total) by mouth 2 (two) times daily.   [DISCONTINUED] ibuprofen (ADVIL) 600 MG tablet Take 1 tablet (600 mg total) by mouth 4 (four) times daily.   [DISCONTINUED] methocarbamol (ROBAXIN-750) 750 MG tablet Take 1 tablet (750 mg total) by mouth 4 (four) times daily.   [DISCONTINUED] oxyCODONE (ROXICODONE) 5 MG immediate release tablet Take 1 tablet (5 mg total) by mouth every 4 (four) hours as needed.   No facility-administered encounter medications on file as of 06/25/2021.    No Known Allergies  Review of Systems  Constitutional:  Negative for activity change, appetite change, chills, diaphoresis, fatigue, fever and unexpected weight change.  HENT: Negative.    Eyes: Negative.   Respiratory:  Negative for cough, chest tightness and shortness of breath.   Cardiovascular:  Negative for chest pain, palpitations and leg swelling.  Gastrointestinal:  Negative for abdominal distention, abdominal pain, anal bleeding, blood in stool, constipation, diarrhea, nausea and vomiting.  Endocrine: Negative.   Genitourinary:  Negative for dysuria, frequency, hematuria and urgency.  Musculoskeletal:  Negative for arthralgias and myalgias.  Skin: Negative.   Allergic/Immunologic: Negative.   Neurological:  Negative for dizziness and headaches.  Hematological: Negative.  Negative for adenopathy. Does not bruise/bleed easily.  Psychiatric/Behavioral:  Negative for confusion, hallucinations,  sleep disturbance and suicidal ideas.   All other systems reviewed and are negative.      Objective:  BP 132/79   Pulse 88   Temp 98.3 F (36.8 C)   Ht 5\' 11"  (1.803 m)   Wt 201 lb (91.2 kg)   SpO2 96%   BMI 28.03 kg/m    Wt Readings from Last 3 Encounters:  06/25/21 201 lb (91.2 kg)  06/14/21 195 lb (88.5 kg)  12/05/20 199 lb (90.3 kg)    Physical Exam Vitals and nursing note reviewed.  Constitutional:      General: He is not in acute distress.    Appearance: Normal appearance. He is well-developed and well-groomed. He is not ill-appearing, toxic-appearing or diaphoretic.  HENT:     Head: Normocephalic and atraumatic.     Jaw: There is normal jaw occlusion.     Right Ear: Hearing normal.     Left Ear: Hearing normal.     Nose: Nose normal.     Mouth/Throat:     Lips: Pink.     Mouth: Mucous membranes are moist.     Pharynx: Oropharynx is clear. Uvula midline.  Eyes:     General: Lids are normal.     Extraocular Movements: Extraocular movements intact.     Conjunctiva/sclera: Conjunctivae normal.     Pupils: Pupils are equal, round, and reactive to light.  Neck:     Thyroid: No thyroid mass, thyromegaly or thyroid tenderness.     Vascular: No carotid bruit or JVD.     Trachea: Trachea and phonation normal.  Cardiovascular:     Rate and Rhythm: Normal rate and regular rhythm.     Chest Wall: PMI is not displaced.     Pulses: Normal pulses.     Heart sounds: Normal heart sounds. No murmur heard.   No friction rub. No gallop.  Pulmonary:     Effort: Pulmonary effort is normal. No respiratory distress.     Breath sounds: Normal breath sounds. No wheezing.  Abdominal:     General: Bowel sounds are normal. There is no distension or abdominal bruit.     Palpations: Abdomen is soft. There is no hepatomegaly, splenomegaly or mass.     Tenderness: There is no abdominal tenderness. There is no right CVA tenderness, left CVA tenderness, guarding or rebound.     Hernia:  No hernia is present.     Comments: Healing surgical wounds from recent appendectomy  Musculoskeletal:        General: Normal range of motion.     Cervical back: Normal range of motion and neck supple.     Right lower leg: No edema.     Left lower leg: No edema.  Lymphadenopathy:     Cervical: No cervical adenopathy.  Skin:    General: Skin is warm and dry.     Capillary Refill: Capillary refill takes less than 2 seconds.     Coloration: Skin is not cyanotic, jaundiced or pale.     Findings: No rash.  Neurological:     General:  No focal deficit present.     Mental Status: He is alert and oriented to person, place, and time.     Cranial Nerves: Cranial nerves are intact.     Sensory: Sensation is intact.     Motor: Motor function is intact.     Coordination: Coordination is intact.     Gait: Gait is intact.     Deep Tendon Reflexes: Reflexes are normal and symmetric.  Psychiatric:        Attention and Perception: Attention and perception normal.        Mood and Affect: Mood and affect normal.        Speech: Speech normal.        Behavior: Behavior normal. Behavior is cooperative.        Thought Content: Thought content normal.        Cognition and Memory: Cognition and memory normal.        Judgment: Judgment normal.    Results for orders placed or performed during the hospital encounter of 06/14/21  Resp Panel by RT-PCR (Flu A&B, Covid) Nasopharyngeal Swab   Specimen: Nasopharyngeal Swab; Nasopharyngeal(NP) swabs in vial transport medium  Result Value Ref Range   SARS Coronavirus 2 by RT PCR NEGATIVE NEGATIVE   Influenza A by PCR NEGATIVE NEGATIVE   Influenza B by PCR NEGATIVE NEGATIVE  CBC with Differential  Result Value Ref Range   WBC 13.8 (H) 4.0 - 10.5 K/uL   RBC 5.20 4.22 - 5.81 MIL/uL   Hemoglobin 15.4 13.0 - 17.0 g/dL   HCT 02.5 42.7 - 06.2 %   MCV 88.7 80.0 - 100.0 fL   MCH 29.6 26.0 - 34.0 pg   MCHC 33.4 30.0 - 36.0 g/dL   RDW 37.6 28.3 - 15.1 %   Platelets  236 150 - 400 K/uL   nRBC 0.0 0.0 - 0.2 %   Neutrophils Relative % 60 %   Neutro Abs 8.4 (H) 1.7 - 7.7 K/uL   Lymphocytes Relative 28 %   Lymphs Abs 3.9 0.7 - 4.0 K/uL   Monocytes Relative 10 %   Monocytes Absolute 1.3 (H) 0.1 - 1.0 K/uL   Eosinophils Relative 1 %   Eosinophils Absolute 0.1 0.0 - 0.5 K/uL   Basophils Relative 1 %   Basophils Absolute 0.1 0.0 - 0.1 K/uL   Immature Granulocytes 0 %   Abs Immature Granulocytes 0.05 0.00 - 0.07 K/uL  Comprehensive metabolic panel  Result Value Ref Range   Sodium 138 135 - 145 mmol/L   Potassium 3.5 3.5 - 5.1 mmol/L   Chloride 103 98 - 111 mmol/L   CO2 26 22 - 32 mmol/L   Glucose, Bld 69 (L) 70 - 99 mg/dL   BUN 13 6 - 20 mg/dL   Creatinine, Ser 7.61 (H) 0.61 - 1.24 mg/dL   Calcium 9.4 8.9 - 60.7 mg/dL   Total Protein 7.1 6.5 - 8.1 g/dL   Albumin 4.5 3.5 - 5.0 g/dL   AST 17 15 - 41 U/L   ALT 22 0 - 44 U/L   Alkaline Phosphatase 57 38 - 126 U/L   Total Bilirubin 0.7 0.3 - 1.2 mg/dL   GFR, Estimated >37 >10 mL/min   Anion gap 9 5 - 15  Lipase, blood  Result Value Ref Range   Lipase 28 11 - 51 U/L  Urinalysis, Routine w reflex microscopic Urine, Clean Catch  Result Value Ref Range   Color, Urine YELLOW YELLOW   APPearance CLEAR CLEAR  Specific Gravity, Urine 1.029 1.005 - 1.030   pH 6.0 5.0 - 8.0   Glucose, UA NEGATIVE NEGATIVE mg/dL   Hgb urine dipstick NEGATIVE NEGATIVE   Bilirubin Urine NEGATIVE NEGATIVE   Ketones, ur NEGATIVE NEGATIVE mg/dL   Protein, ur NEGATIVE NEGATIVE mg/dL   Nitrite NEGATIVE NEGATIVE   Leukocytes,Ua NEGATIVE NEGATIVE  Surgical pathology  Result Value Ref Range   SURGICAL PATHOLOGY      SURGICAL PATHOLOGY CASE: [id number] PATIENT: Danise Edge Surgical Pathology Report     Clinical History: appendicitis (cm)   FINAL MICROSCOPIC DIAGNOSIS:  A. APPENDIX, APPENDECTOMY: -  Acute appendicitis   GROSS DESCRIPTION:  A.  Received fresh and subsequently placed in formalin labeled with  the patient's name and "Appendix" is an 8.3 x 0.6 cm red-purple vermiform appendix with up to 0.8 cm of attached mesoappendix, diffusely covered in a moderate amount of adhesions.  Cross-sectioning reveals a 0.1 cm thick, indurated wall surrounds red-tan, grossly unremarkable mucosa and a 0.7 cm lumen filled with red material.  Neither a discrete perforation nor a fecalith is grossly identified.  Representative sections, to include the proximal margin (inked black) and half of the distal tip, are submitted in a single cassette.  (LEF 06/17/2021)    Final Diagnosis performed by Manning Charity, MD.   Electronically signed 06/18/2021 Technical and / or Professional  components performed at [facility]. [facility], [address] [place], [place] [address].  Immunohistochemistry Technical component (if applicable) was performed at [facility]. [address], [place], [place] [address].   IMMUNOHISTOCHEMISTRY DISCLAIMER (if applicable): Some of these immunohistochemical stains may have been developed and the performance characteristics determine by [facility]. Some may not have been cleared or approved by the U.S. Food and Drug Administration. The FDA has determined that such clearance or approval is not necessary. This test is used for clinical purposes. It should not be regarded as investigational or for research. This laboratory is certified under the Clinical Laboratory Improvement Amendments of 1988 (CLIA-88) as qualified to perform high complexity clinical laboratory testing.  The controls stained appropriately.        Pertinent labs & imaging results that were available during my care of the patient were reviewed by me and considered in my medical decision making.  Assessment & Plan:  Cayson was seen today for referral mri.  Diagnoses and all orders for this visit:  Hypodense mass of right lobe of liver Lobulated hypodense 4 cm  parenchymal lesion of right subcapsular hepatic lobe noted on recent CT abdomen. Follow up MRI recommended. Order placed today. Pt asymptomatic.  -     MR Abdomen W Wo Contrast; Future     Continue all other maintenance medications.  Follow up plan: Return if symptoms worsen or fail to improve. Will decide upon follow up and treatment plan once MRI results.   The above assessment and management plan was discussed with the patient. The patient verbalized understanding of and has agreed to the management plan. Patient is aware to call the clinic if they develop any new symptoms or if symptoms persist or worsen. Patient is aware when to return to the clinic for a follow-up visit. Patient educated on when it is appropriate to go to the emergency department.   Kari Baars, FNP-C [facility] [phone]

## 2021-07-10 ENCOUNTER — Other Ambulatory Visit: Payer: Self-pay

## 2021-07-10 ENCOUNTER — Ambulatory Visit (HOSPITAL_COMMUNITY)
Admission: RE | Admit: 2021-07-10 | Discharge: 2021-07-10 | Disposition: A | Discharge status: Home / Self Care | Payer: 59 | Source: Ambulatory Visit | Attending: Family Medicine | Admitting: Family Medicine

## 2021-07-10 DIAGNOSIS — R160 Hepatomegaly, not elsewhere classified: Secondary | ICD-10-CM | POA: Diagnosis not present

## 2021-07-10 DIAGNOSIS — Z9049 Acquired absence of other specified parts of digestive tract: Secondary | ICD-10-CM | POA: Diagnosis not present

## 2021-07-10 DIAGNOSIS — K7689 Other specified diseases of liver: Secondary | ICD-10-CM | POA: Diagnosis not present

## 2021-07-10 DIAGNOSIS — R935 Abnormal findings on diagnostic imaging of other abdominal regions, including retroperitoneum: Secondary | ICD-10-CM | POA: Diagnosis not present

## 2021-07-10 MED ORDER — GADOBUTROL 1 MMOL/ML IV SOLN
10.0000 mL | Freq: Once | INTRAVENOUS | Status: AC | PRN
  Administered 2021-07-10: 10 mL via INTRAVENOUS

## 2021-07-16 DIAGNOSIS — F411 Generalized anxiety disorder: Secondary | ICD-10-CM | POA: Diagnosis not present

## 2021-07-16 DIAGNOSIS — F331 Major depressive disorder, recurrent, moderate: Secondary | ICD-10-CM | POA: Diagnosis not present

## 2021-08-26 DIAGNOSIS — F411 Generalized anxiety disorder: Secondary | ICD-10-CM | POA: Diagnosis not present

## 2021-08-26 DIAGNOSIS — F331 Major depressive disorder, recurrent, moderate: Secondary | ICD-10-CM | POA: Diagnosis not present

## 2021-09-25 ENCOUNTER — Telehealth: Payer: Self-pay | Admitting: Family Medicine

## 2021-09-25 NOTE — Telephone Encounter (Signed)
REFERRAL REQUEST Telephone Note  Have you been seen at our office for this problem? YES (Advise that they may need an appointment with their PCP before a referral can be done)  Reason for Referral: MRI f/u 3-6 months and mother states he needs a referral  Referral discussed with patient: YES  Best contact number of patient for referral team: [phone]    Has patient been seen by a specialist for this issue before: NO  Patient provider preference for referral: Jeani Hawking Patient location preference for referral: [place]   Patient notified that referrals can take up to a week or longer to process. If they haven't heard anything within a week they should call back and speak with the referral department.

## 2021-10-03 ENCOUNTER — Other Ambulatory Visit: Payer: Self-pay

## 2021-10-03 ENCOUNTER — Ambulatory Visit: Payer: 59 | Admitting: Family Medicine

## 2021-10-03 ENCOUNTER — Encounter: Payer: Self-pay | Admitting: Family Medicine

## 2021-10-03 VITALS — BP 130/76 | HR 81 | Ht 71.0 in | Wt 208.0 lb

## 2021-10-03 DIAGNOSIS — K769 Liver disease, unspecified: Secondary | ICD-10-CM | POA: Diagnosis not present

## 2021-10-03 NOTE — Telephone Encounter (Signed)
Will need appt to get coverage by insurance

## 2021-10-03 NOTE — Telephone Encounter (Signed)
Spoke with mom, Dr. Warrick Parisian has cancellation today at 1:55 pm so they will be here for appointment then.

## 2021-10-03 NOTE — Progress Notes (Signed)
BP 130/76    Pulse 81    Ht 5\' 11"  (1.803 m)    Wt 208 lb (94.3 kg)    SpO2 98%    BMI 29.01 kg/m    Subjective:   Patient ID: , male    DOB: 05-Sep-1996, 26 y.o.   MRN: [id number]  HPI: Jacob Jennings is a 26 y.o. male presenting on 10/03/2021 for Mass (Liver)   HPI Patient is coming in today for follow-up on a liver lesion on his right hemiliver that was a concerning area that could be a fatty deposit but were also concerned that they could not rule out an adenoma.  He denies any nausea or vomiting or abdominal pain or indigestion or feeling any lumps or bumps or masses.  He just mainly coming in today for recheck and repeat on this.  Relevant past medical, surgical, family and social history reviewed and updated as indicated. Interim medical history since our last visit reviewed. Allergies and medications reviewed and updated.  Review of Systems  Constitutional:  Negative for chills and fever.  Respiratory:  Negative for shortness of breath and wheezing.   Cardiovascular:  Negative for chest pain and leg swelling.  Gastrointestinal:  Negative for abdominal distention, abdominal pain, diarrhea, nausea and vomiting.  Musculoskeletal:  Negative for back pain and gait problem.  Skin:  Negative for rash.  All other systems reviewed and are negative.  Per HPI unless specifically indicated above   Allergies as of 10/03/2021   No Known Allergies      Medication List        Accurate as of October 03, 2021  2:26 PM. If you have any questions, ask your nurse or doctor.          buPROPion 150 MG 24 hr tablet Commonly known as: WELLBUTRIN XL Take 150 mg by mouth daily.   FLUoxetine 20 MG tablet Commonly known as: PROZAC Take 20 mg by mouth daily.         Objective:   BP 130/76    Pulse 81    Ht 5\' 11"  (1.803 m)    Wt 208 lb (94.3 kg)    SpO2 98%    BMI 29.01 kg/m   Wt Readings from Last 3 Encounters:  10/03/21 208 lb (94.3 kg)  06/25/21 201 lb  (91.2 kg)  06/14/21 195 lb (88.5 kg)    Physical Exam Vitals and nursing note reviewed.  Constitutional:      General: He is not in acute distress.    Appearance: He is well-developed. He is not diaphoretic.  Eyes:     General: No scleral icterus.    Conjunctiva/sclera: Conjunctivae normal.  Neck:     Thyroid: No thyromegaly.  Musculoskeletal:        General: Normal range of motion.     Cervical back: Neck supple.  Lymphadenopathy:     Cervical: No cervical adenopathy.  Skin:    General: Skin is warm and dry.     Findings: No rash.  Neurological:     Mental Status: He is alert and oriented to person, place, and time.     Coordination: Coordination normal.  Psychiatric:        Behavior: Behavior normal.      Assessment & Plan:   Problem List Items Addressed This Visit   None Visit Diagnoses     Liver lesion    -  Primary       Patient had previous  MRI that showed liver spots, possibly fatty tumor but could not rule out adenoma and wanted [place] to repeat in 3 to 6 months.  This initial 1 was on 07/10/2021. Follow up plan: Return if symptoms worsen or fail to improve.  Counseling provided for all of the vaccine components No orders of the defined types were placed in this encounter.   Arville Care, MD [facility] 10/03/2021, 2:26 PM

## 2021-10-07 DIAGNOSIS — F331 Major depressive disorder, recurrent, moderate: Secondary | ICD-10-CM | POA: Diagnosis not present

## 2021-10-07 DIAGNOSIS — F411 Generalized anxiety disorder: Secondary | ICD-10-CM | POA: Diagnosis not present

## 2021-10-22 ENCOUNTER — Telehealth: Payer: Self-pay | Admitting: Family Medicine

## 2021-10-22 DIAGNOSIS — R160 Hepatomegaly, not elsewhere classified: Secondary | ICD-10-CM

## 2021-10-22 DIAGNOSIS — K769 Liver disease, unspecified: Secondary | ICD-10-CM

## 2021-10-22 NOTE — Telephone Encounter (Signed)
Prev MRI 07/10/2021 Per result note: Given atypical location and presence of microscopic lipid would suggest a 3 to six-month follow-up to ensure continued decrease in size and or resolution.

## 2021-10-22 NOTE — Telephone Encounter (Signed)
Mom called stating that Jacob Jennings has not received pts MRI order. Needs Dr Dettinger to send it to them.  Says last MRI was done 3 mths ago and they were told that MRI needed to be done again in 3 mths which would be now.   Please advise and call mom.

## 2021-10-22 NOTE — Telephone Encounter (Signed)
Placed order for repeat MRI

## 2021-10-23 ENCOUNTER — Other Ambulatory Visit: Payer: Self-pay | Admitting: Family Medicine

## 2021-10-23 DIAGNOSIS — K769 Liver disease, unspecified: Secondary | ICD-10-CM

## 2021-10-23 NOTE — Telephone Encounter (Signed)
Mother aware, if she does not hear from scheduling for the MRI in the next few days she will call us back.

## 2021-10-23 NOTE — Progress Notes (Signed)
Corrected order for patient

## 2021-11-05 ENCOUNTER — Other Ambulatory Visit: Payer: Self-pay

## 2021-11-05 ENCOUNTER — Ambulatory Visit (HOSPITAL_COMMUNITY)
Admission: RE | Admit: 2021-11-05 | Discharge: 2021-11-05 | Disposition: A | Discharge status: Home / Self Care | Payer: 59 | Source: Ambulatory Visit | Attending: Family Medicine | Admitting: Family Medicine

## 2021-11-05 DIAGNOSIS — K769 Liver disease, unspecified: Secondary | ICD-10-CM | POA: Insufficient documentation

## 2021-11-05 DIAGNOSIS — K839 Disease of biliary tract, unspecified: Secondary | ICD-10-CM | POA: Diagnosis not present

## 2021-11-05 DIAGNOSIS — K7689 Other specified diseases of liver: Secondary | ICD-10-CM | POA: Diagnosis not present

## 2021-11-05 MED ORDER — GADOBUTROL 1 MMOL/ML IV SOLN
10.0000 mL | Freq: Once | INTRAVENOUS | Status: AC | PRN
  Administered 2021-11-05: 10 mL via INTRAVENOUS

## 2021-11-08 DIAGNOSIS — F411 Generalized anxiety disorder: Secondary | ICD-10-CM | POA: Diagnosis not present

## 2021-11-08 DIAGNOSIS — F331 Major depressive disorder, recurrent, moderate: Secondary | ICD-10-CM | POA: Diagnosis not present

## 2021-12-20 DIAGNOSIS — F331 Major depressive disorder, recurrent, moderate: Secondary | ICD-10-CM | POA: Diagnosis not present

## 2021-12-20 DIAGNOSIS — F411 Generalized anxiety disorder: Secondary | ICD-10-CM | POA: Diagnosis not present

## 2022-01-01 DIAGNOSIS — F411 Generalized anxiety disorder: Secondary | ICD-10-CM | POA: Diagnosis not present

## 2022-01-01 DIAGNOSIS — F331 Major depressive disorder, recurrent, moderate: Secondary | ICD-10-CM | POA: Diagnosis not present

## 2022-03-12 IMAGING — CT CT ABD-PELV W/ CM
2 of 4 series · 16 of 46 positions shown, 18 images · IV contrast (APPLIED)
Comparison: None.

CLINICAL DATA: 25-year-old male with right lower quadrant abdominal
pain.

EXAM:
CT ABDOMEN AND PELVIS WITH CONTRAST
TECHNIQUE: Multidetector CT imaging of the abdomen and pelvis was performed
using the standard protocol following bolus administration of
intravenous contrast.
CONTRAST:  75mL OMNIPAQUE IOHEXOL 350 MG/ML SOLN

[Series 3: abdomen 5.0 · axial · 0.94mm/px · z∈[+777,+1257]mm · 13 of 108 slices shown, 15 images]
[im 6/108  soft-tissue]
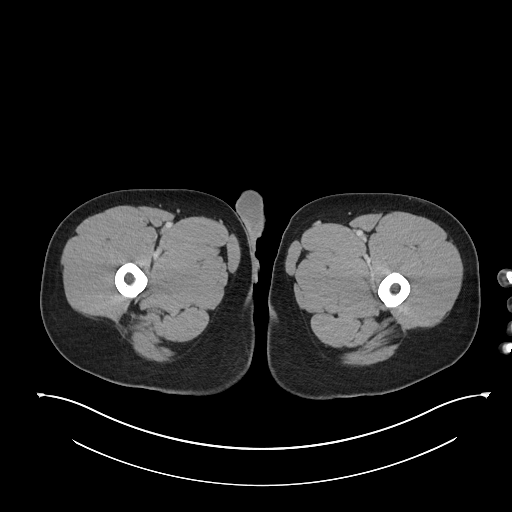
[im 6/108  bone]
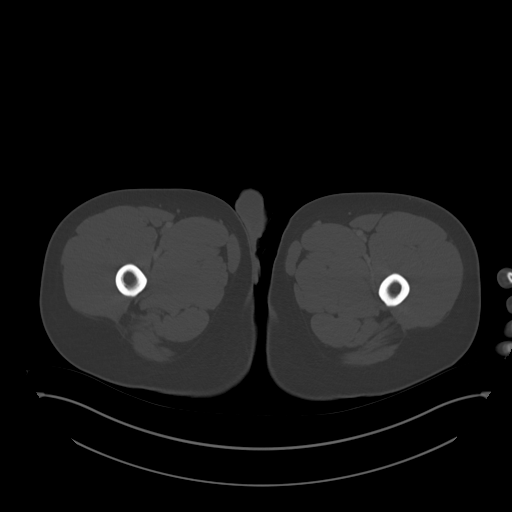
[im 17/108  soft-tissue]
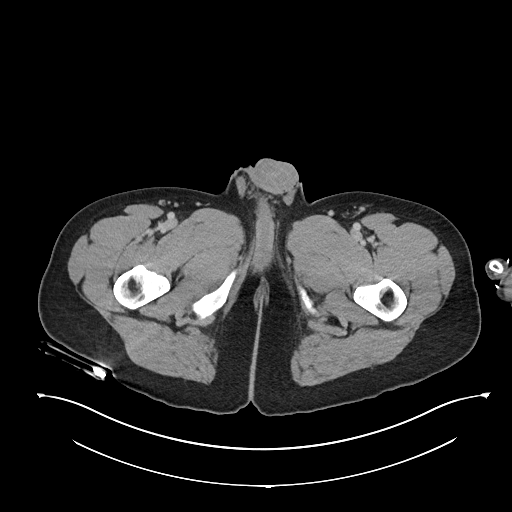
[im 22/108  soft-tissue]
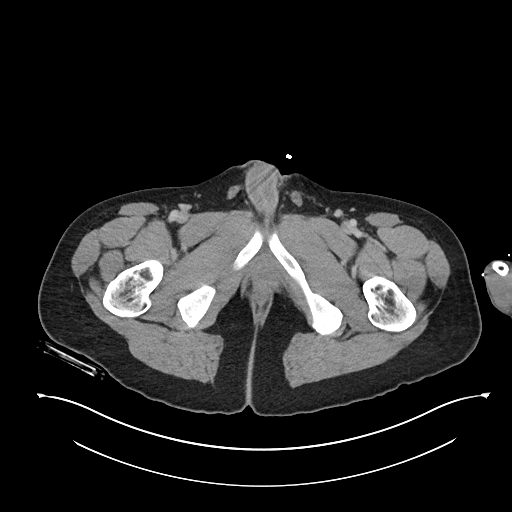
[im 33/108  soft-tissue]
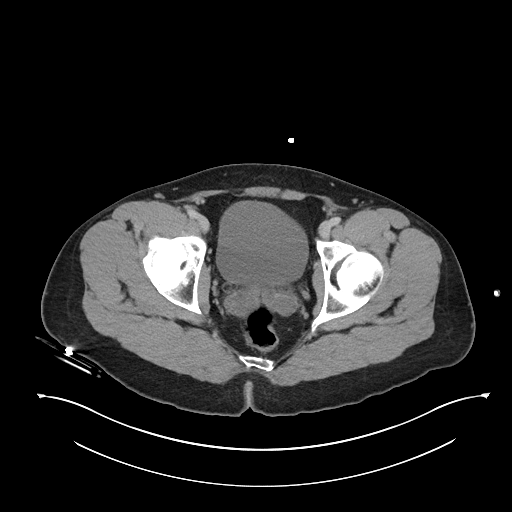
[im 38/108  soft-tissue]
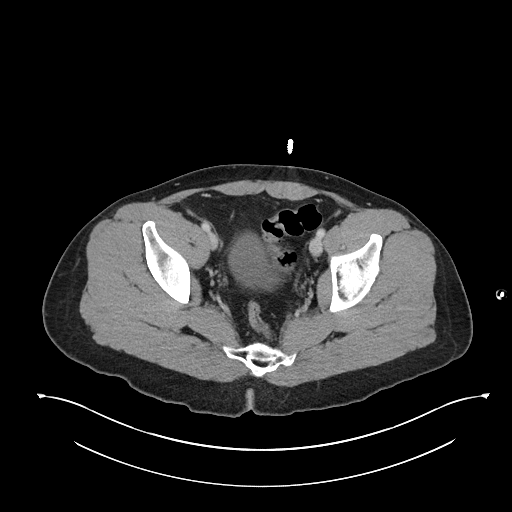
[im 49/108  soft-tissue]
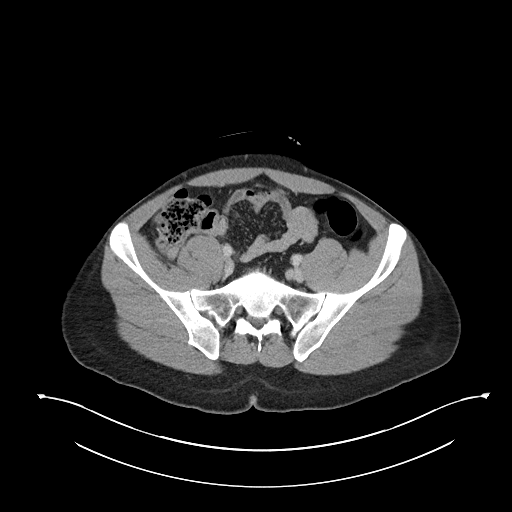
[im 54/108  soft-tissue]
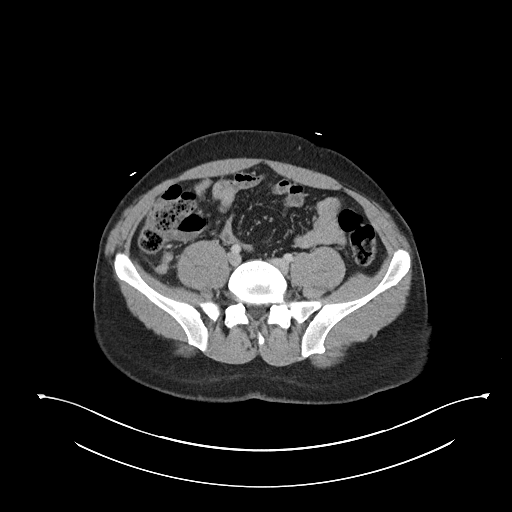
[im 59/108  soft-tissue]
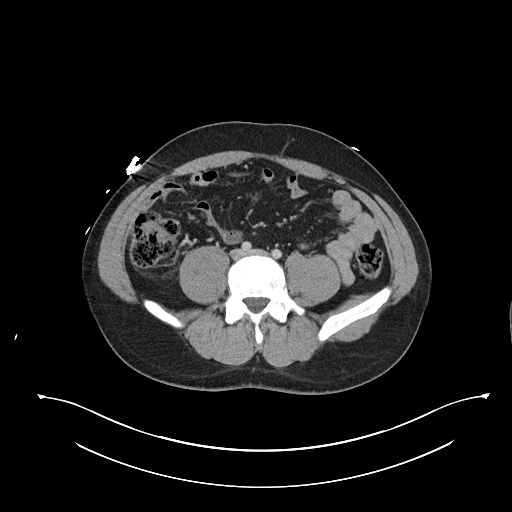
[im 70/108  soft-tissue]
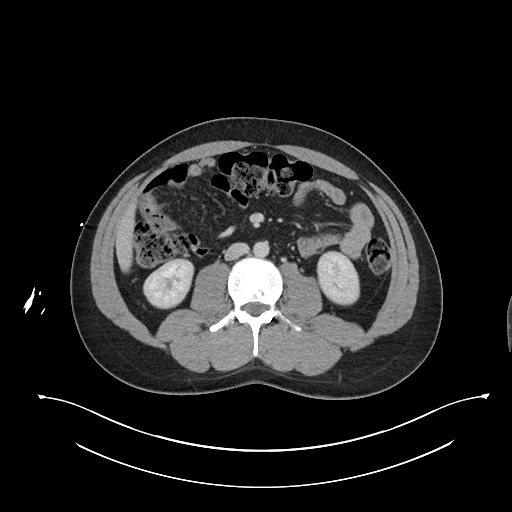
[im 70/108  bone]
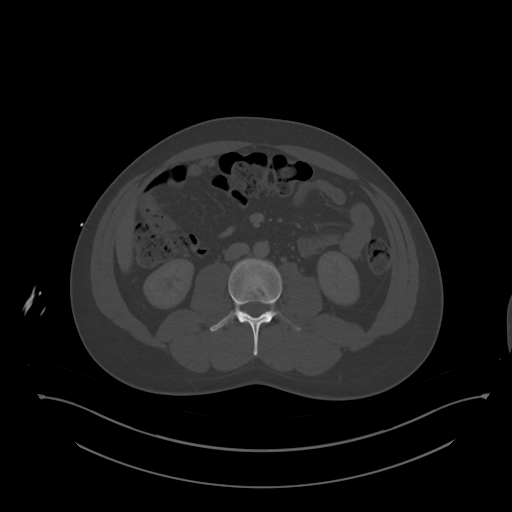
[im 75/108  soft-tissue]
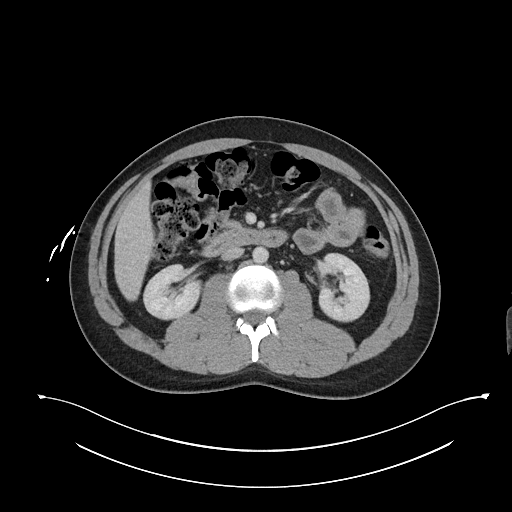
[im 86/108  soft-tissue]
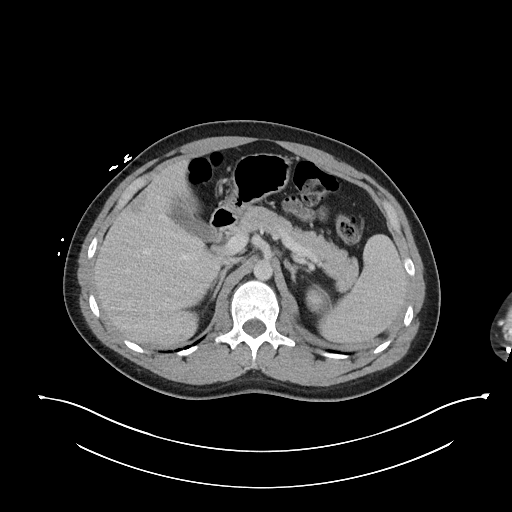
[im 91/108  soft-tissue]
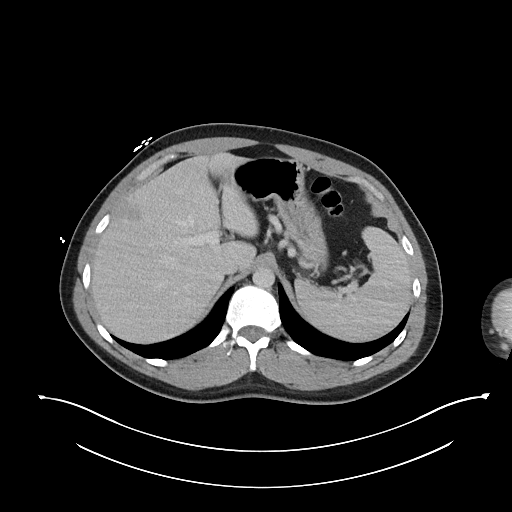
[im 102/108  soft-tissue]
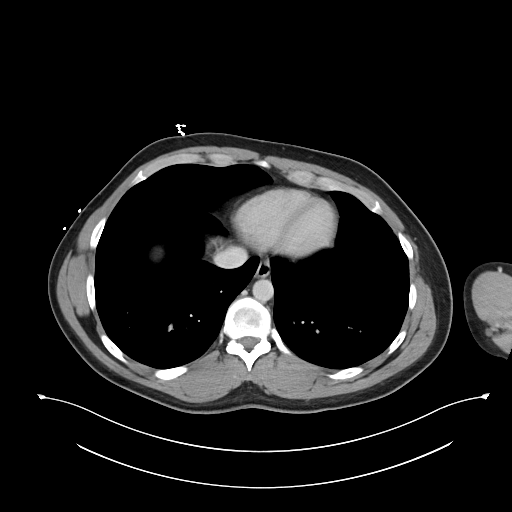

[Series 6: abdomen 3.0 mpr cor · coronal · 0.86mm/px · 3 of 97 slices shown]
[im 33/97  soft-tissue]
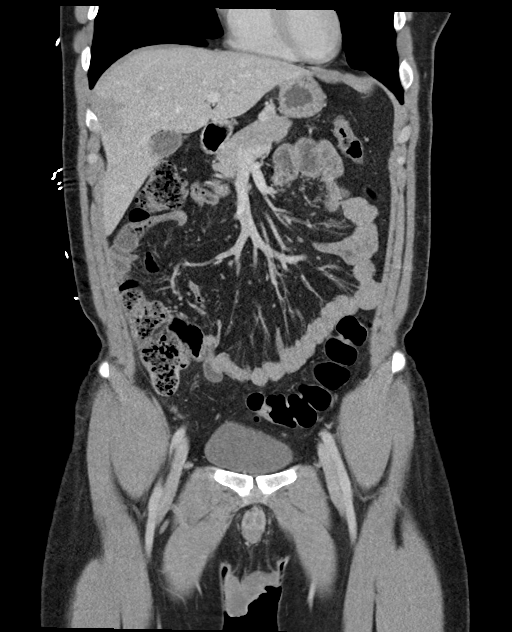
[im 43/97  soft-tissue]
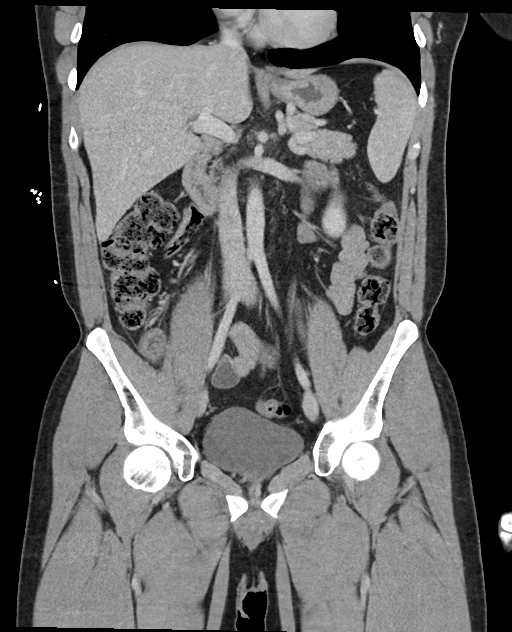
[im 54/97  soft-tissue]
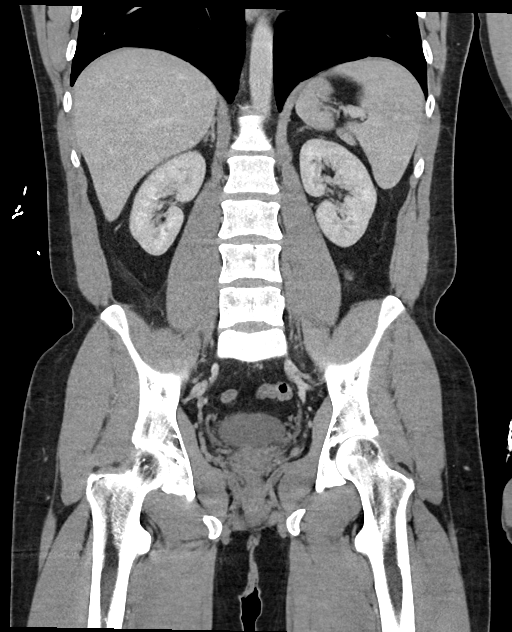

[16 of 46 positions shown; findings below may reference images not displayed]

FINDINGS: Lower chest: Negative.

Hepatobiliary: Lobulated hypodense, nearly isodense roughly 4 cm
parenchymal lesion of the right subcapsular hepatic lobe. See series
3, image 20 and series 6, image 32. Elsewhere liver enhancement is
normal. Negative gallbladder and bile ducts.

Pancreas: Negative.

Spleen: Negative.

Adrenals/Urinary Tract: Negative.

Stomach/Bowel: Negative large bowel from the ascending colon
distally. Retrocecal appendix is enlarged and indistinct (series 3,
image 54 and coronal image 47) with inflammatory stranding at the
tip of the appendix (coronal image 51).

Appendix: Location: Retrocecal

Diameter: 9-10 mm

Appendicolith: Not identified

Mucosal hyper-enhancement: Mild

Extraluminal gas: Negative

Periappendiceal collection: Negative, regional inflammatory
stranding.

Terminal ileum is mildly dilated but otherwise appears normal.
Upstream small bowel is decompressed. Negative stomach and duodenum.
No free air or free fluid.

Vascular/Lymphatic: Major arterial structures in the abdomen and
pelvis appear patent and normal. Portal venous system is patent. No
lymphadenopathy.

Reproductive: Negative.

Other: No pelvic free fluid.

Musculoskeletal: Negative.
IMPRESSION: 1. Positive for Acute Appendicitis. No evidence of perforation or
abscess.

2. Indeterminate but probably benign 4 cm liver lesion in the
periphery of the right hepatic lobe. This meets consensus criteria
for routine follow-up Abdomen MRI (without and with contrast, liver
protocol) to further characterize.
This recommendation follows ACR consensus guidelines: Management of
Incidental Liver Lesions on CT: A White Paper of the ACR Incidental
Findings Committee. [HOSPITAL] 2425; [id number].

## 2022-06-27 DIAGNOSIS — F411 Generalized anxiety disorder: Secondary | ICD-10-CM | POA: Diagnosis not present

## 2022-06-27 DIAGNOSIS — F331 Major depressive disorder, recurrent, moderate: Secondary | ICD-10-CM | POA: Diagnosis not present

## 2022-07-07 ENCOUNTER — Telehealth: Payer: Self-pay | Admitting: Family Medicine

## 2022-07-07 DIAGNOSIS — F331 Major depressive disorder, recurrent, moderate: Secondary | ICD-10-CM | POA: Diagnosis not present

## 2022-07-07 DIAGNOSIS — F411 Generalized anxiety disorder: Secondary | ICD-10-CM | POA: Diagnosis not present

## 2022-07-07 NOTE — Telephone Encounter (Signed)
He just needs to come back in anytime between now because of send between 6 and 12 months and then we can get him set up with repeat so just have her make another appointment to come in to be seen

## 2022-07-07 NOTE — Telephone Encounter (Signed)
Per Dr. Warrick Parisian Jacob Jennings needs to be scheduled in the next 3-4 weeks to discuss repeat scan of liver.  Appt scheduled with mom for 11/15 at 11:40am

## 2022-08-02 IMAGING — MR MR ABDOMEN WO/W CM
20 series · 48 of 48 positions shown · IV contrast (gadavist)
Comparison: CT June 15, 2021 and MRI July 10, 2021

CLINICAL DATA: Follow-up hepatic lesion/abnormality seen on prior
imaging

EXAM:
MRI ABDOMEN WITHOUT AND WITH CONTRAST
TECHNIQUE: Multiplanar multisequence MR imaging of the abdomen was performed
both before and after the administration of intravenous contrast.
CONTRAST:  10mL GADAVIST GADOBUTROL 1 MMOL/ML IV SOLN

[Series 3: cor haste · coronal · 6.0mm · 1.19mm/px · 1 of 35 slices shown]
[im 1/35]
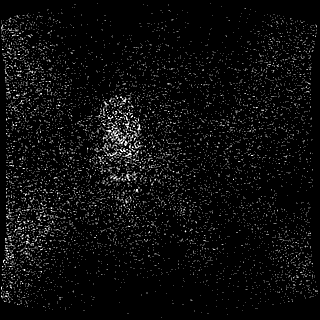

[Series 4: ax haste · axial · 6.0mm · 1.31mm/px · 1 of 33 slices shown]
[im 1/33]
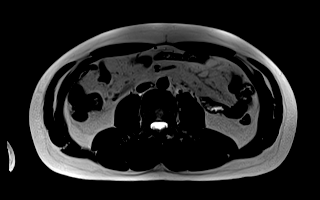

[Series 6: T2 fat-sat · axial · 6.0mm · 1.25mm/px · 1 of 33 slices shown]
[im 1/33]
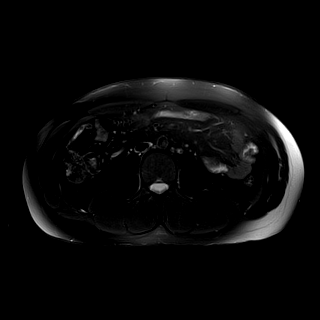

[Series 7: DWI · axial · 6.0mm · 1.57mm/px · 1 of 41 slices shown (1 of 4)]
[im 1/41]
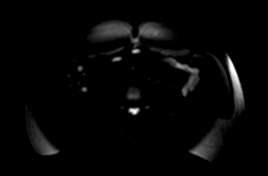

[Series 7: DWI · axial · 6.0mm · 1.57mm/px · z∈[-208,+80]mm · 2 of 41 slices shown (2 of 4)]
[im 1/41]
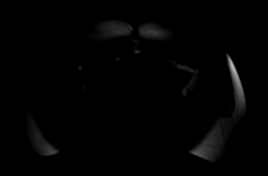
[im 41/41]
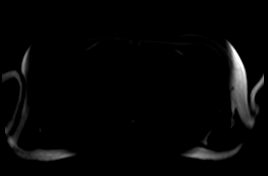

[Series 7: DWI · axial · 6.0mm · 1.57mm/px · z∈[-208,+80]mm · 2 of 41 slices shown (3 of 4)]
[im 1/41]
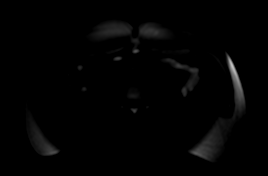
[im 41/41]
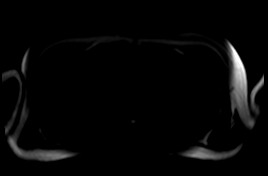

[Series 8: DWI · axial · 6.0mm · 1.57mm/px · z∈[-208,+80]mm · 2 of 41 slices shown (4 of 4)]
[im 1/41]
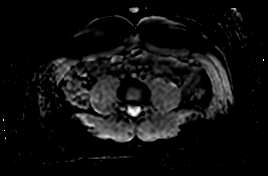
[im 41/41]
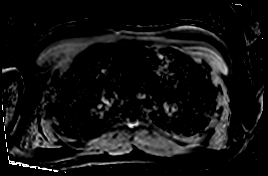

[Series 9: bSSFP · axial · 6.0mm · 0.82mm/px · z∈[-181,+53]mm · 2 of 40 slices shown]
[im 1/40]
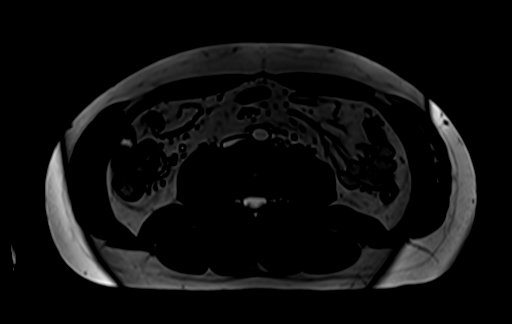
[im 40/40]
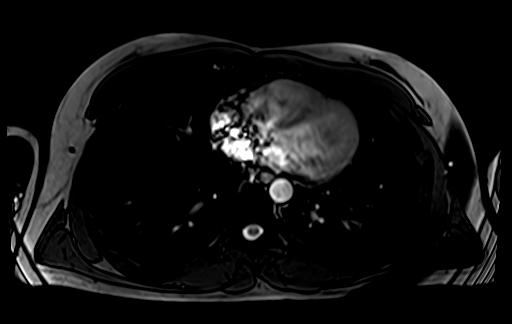

[Series 10: ax in and · axial · 3.0mm · 1.25mm/px · z∈[-182,+55]mm · 3 of 80 slices shown (1 of 2)]
[im 1/80]
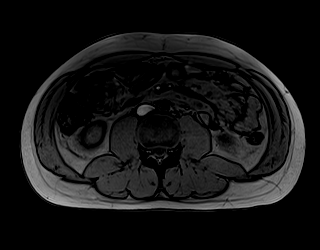
[im 40/80]
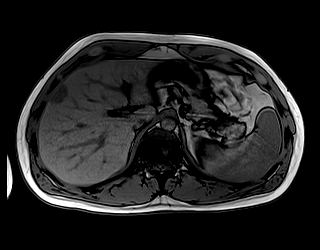
[im 80/80]
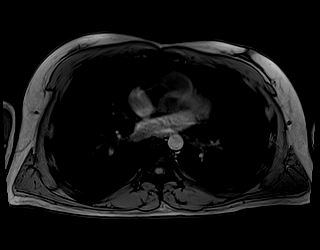

[Series 11: ax in and · axial · 3.0mm · 1.25mm/px · z∈[-182,+55]mm · 3 of 80 slices shown (2 of 2)]
[im 1/80]
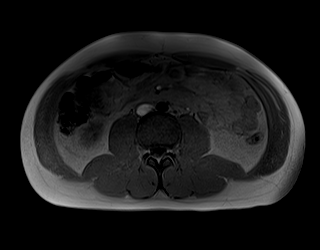
[im 40/80]
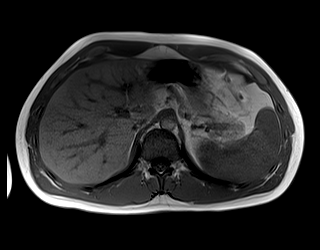
[im 80/80]
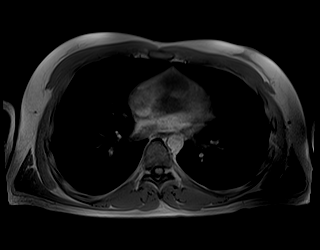

[Series 12: T1 dynamic · axial · non-contrast · 3.0mm · 1.25mm/px · z∈[-182,+55]mm · 3 of 80 slices shown (1 of 4)]
[im 1/80]
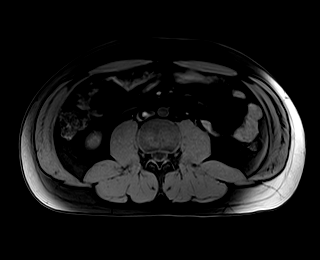
[im 40/80]
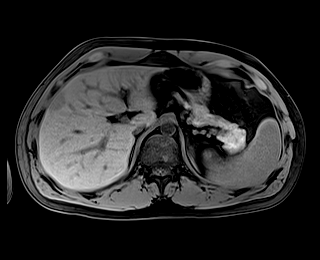
[im 80/80]
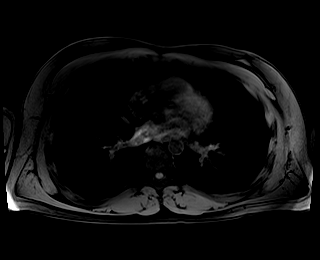

[Series 14: T1 dynamic post-contrast · axial · 3.0mm · 1.25mm/px · z∈[-182,+55]mm · 3 of 80 slices shown (1 of 6)]
[im 1/80]
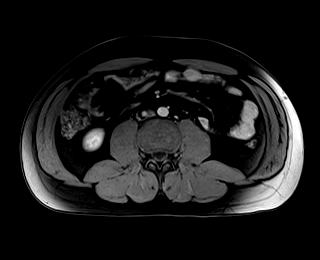
[im 40/80]
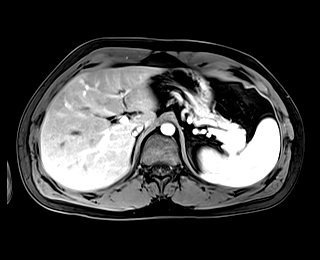
[im 80/80]
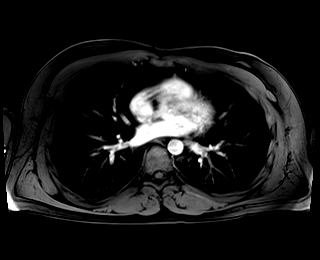

[Series 15: T1 dynamic · axial · 3.0mm · 1.25mm/px · z∈[-182,+55]mm · 3 of 80 slices shown (2 of 4)]
[im 1/80]
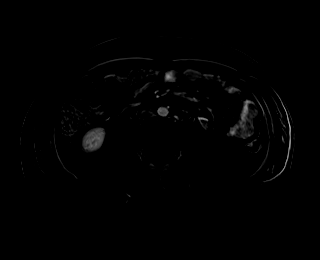
[im 40/80]
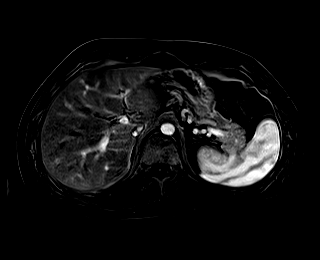
[im 80/80]
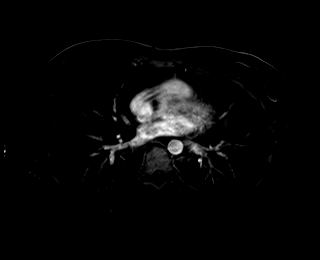

[Series 16: T1 dynamic post-contrast · axial · 3.0mm · 1.25mm/px · z∈[-182,+55]mm · 3 of 80 slices shown (2 of 6)]
[im 1/80]
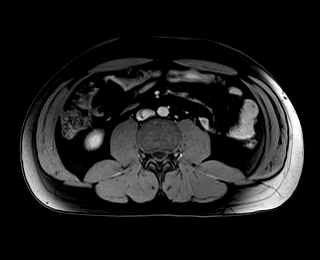
[im 40/80]
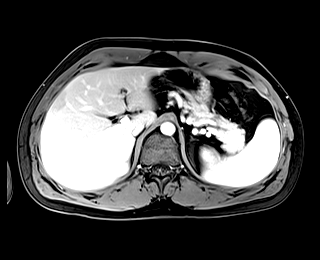
[im 80/80]
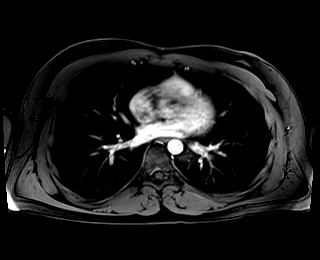

[Series 17: T1 dynamic · axial · 3.0mm · 1.25mm/px · z∈[-182,+55]mm · 3 of 80 slices shown (3 of 4)]
[im 1/80]
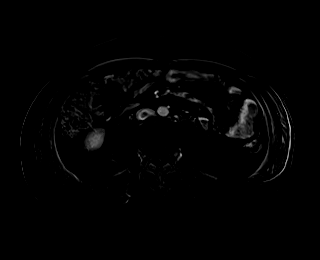
[im 40/80]
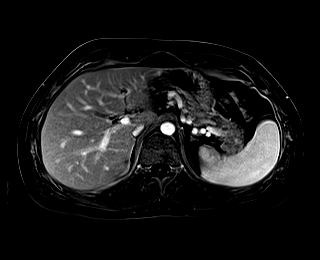
[im 80/80]
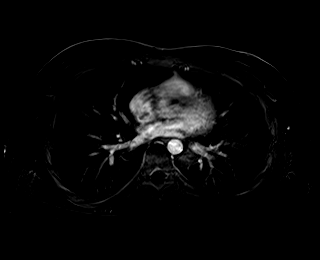

[Series 18: T1 dynamic post-contrast · axial · 3.0mm · 1.25mm/px · z∈[-182,+55]mm · 3 of 80 slices shown (3 of 6)]
[im 1/80]
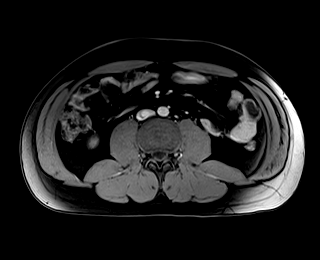
[im 40/80]
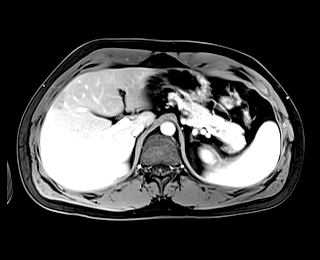
[im 80/80]
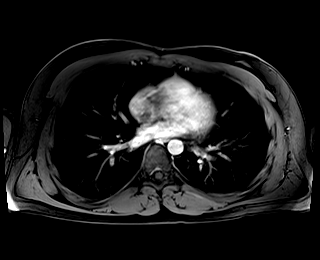

[Series 19: T1 dynamic · axial · 3.0mm · 1.25mm/px · z∈[-182,+55]mm · 3 of 80 slices shown (4 of 4)]
[im 1/80]
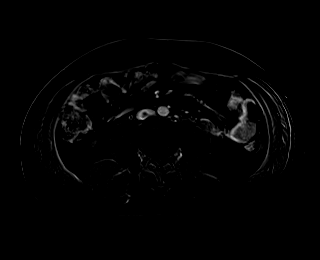
[im 40/80]
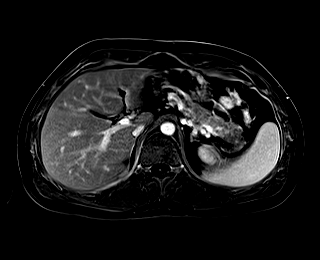
[im 80/80]
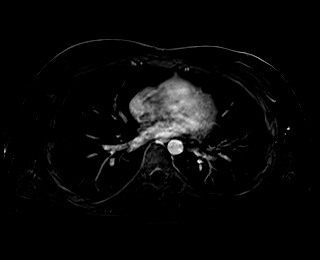

[Series 20: T1 dynamic post-contrast · coronal · 3.0mm · 1.25mm/px · 3 of 72 slices shown (4 of 6)]
[im 1/72]
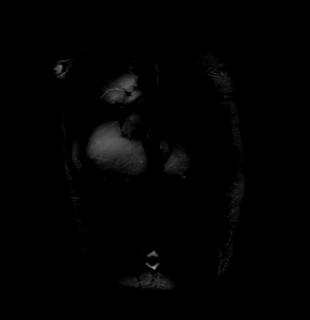
[im 36/72]
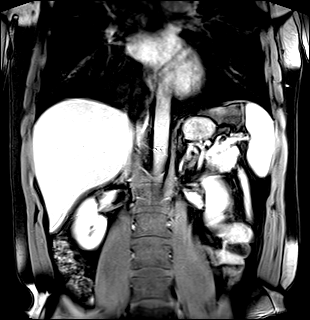
[im 72/72]
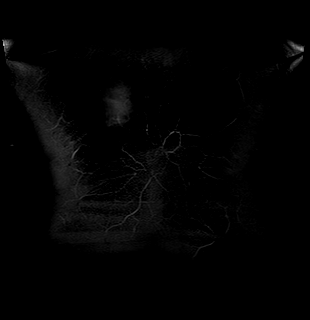

[Series 21: T1 dynamic post-contrast · axial · 3.0mm · 1.25mm/px · z∈[-182,+55]mm · 3 of 80 slices shown (5 of 6)]
[im 1/80]
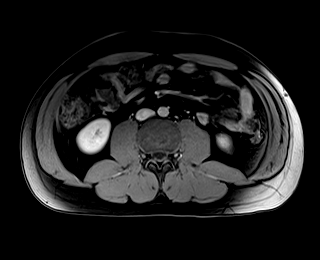
[im 40/80]
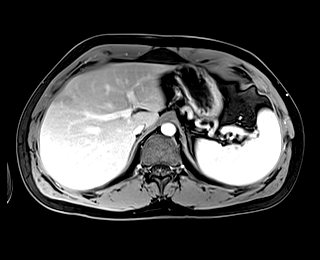
[im 80/80]
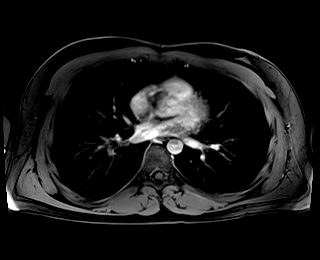

[Series 22: T1 dynamic post-contrast · axial · 3.0mm · 1.25mm/px · z∈[-182,+55]mm · 3 of 80 slices shown (6 of 6)]
[im 1/80]
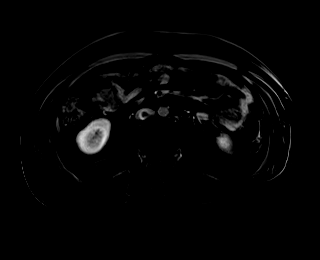
[im 40/80]
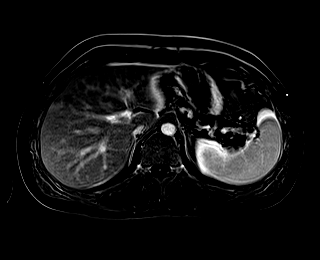
[im 80/80]
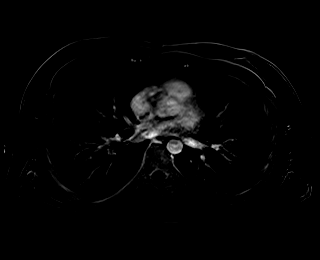

[48 of 48 positions shown; findings below may reference images not displayed]

FINDINGS: Lower chest: No acute abnormality.

Hepatobiliary: Masslike area in the periphery of hepatic segment VI
measures 2.9 cm on image 40/10 unchanged from prior. Once again this
area demonstrates loss of signal on out of phase imaging with
intrinsic T1 hypointensity and mild increased T2 signal on image
[DATE]. Postcontrast administration the lesion does demonstrate
enhancement which is similar to that of background liver when
accounting for differences in intrinsic T1 signal. No new suspicious
hepatic lesions. Gallbladder is unremarkable. No biliary ductal
dilation.

Pancreas: Intrinsic T1 signal of the pancreatic parenchyma is within
normal limits. Pancreas demonstrates homogeneous postcontrast
enhancement. No pancreatic ductal dilation. No cystic or solid
hyperenhancing pancreatic lesion identified.

Spleen:  Within normal limits in size and appearance.

Adrenals/Urinary Tract: Bilateral adrenal glands appear normal. No
hydronephrosis. No solid enhancing renal mass.

Stomach/Bowel: Visualized portions within the abdomen are
unremarkable.

Vascular/Lymphatic: No pathologically enlarged lymph nodes
identified. Portal, splenic and superior mesenteric veins are
patent. No abdominal aortic aneurysm demonstrated.

Other:  No significant abdominal free fluid.

Musculoskeletal: No suspicious bone lesions identified.
IMPRESSION: Stable masslike 2.9 cm area in the periphery of hepatic segment VI,
which demonstrates imaging characteristics again most consistent
with nodular focal fatty infiltration and while atypical in location
this may reflect sequela of prior abdominal inflammation. A primary
additional differential consideration is a benign hepatic adenoma.
While this is almost certainly a benign finding, given the
differential considerations and its location, recommend follow-up
MRI in 6-12 months with and without EOVIST contrast to further
assess stability and for possible more definitive characterization.
No new suspicious hepatic lesions identified.

## 2022-08-06 ENCOUNTER — Ambulatory Visit: Payer: 59 | Admitting: Family Medicine

## 2022-08-06 ENCOUNTER — Encounter: Payer: Self-pay | Admitting: Family Medicine

## 2022-08-06 VITALS — BP 133/82 | HR 74 | Temp 98.4°F | Ht 71.0 in | Wt 198.0 lb

## 2022-08-06 DIAGNOSIS — K769 Liver disease, unspecified: Secondary | ICD-10-CM | POA: Diagnosis not present

## 2022-08-06 DIAGNOSIS — Z23 Encounter for immunization: Secondary | ICD-10-CM | POA: Diagnosis not present

## 2022-08-06 NOTE — Progress Notes (Signed)
BP 133/82   Pulse 74   Temp 98.4 F (36.9 C)   Ht _0  (1.803 m)   Wt 198 lb (89.8 kg)   SpO2 97%   BMI 27.62 kg/m    Subjective:   Patient ID: Jacob Jennings, male    DOB: [birth year], 26 y.o.   MRN: [id number]  HPI: Jacob Jennings is a 26 y.o. male presenting on 08/06/2022 for liver lesion (Discuss repeat scan)   HPI Liver lesion Patient is coming in today for repeat scan of his liver lesion.  He had 2.9 cm lesion on his liver that was scanned and recommended repeat scan in 6 to 12 months.  Is been about 9 months.  He denies any abdominal pain or pain in the liver area.  He denies any change in bowel movements or stool issues.  Just  Relevant past medical, surgical, family and social history reviewed and updated as indicated. Interim medical history since our last visit reviewed. Allergies and medications reviewed and updated.  Review of Systems  Constitutional:  Negative for chills and fever.  Eyes:  Negative for visual disturbance.  Respiratory:  Negative for shortness of breath and wheezing.   Cardiovascular:  Negative for chest pain and leg swelling.  Gastrointestinal:  Negative for abdominal distention, abdominal pain, constipation, diarrhea, nausea and vomiting.  Musculoskeletal:  Negative for back pain and gait problem.  Skin:  Negative for rash.  Neurological:  Negative for dizziness, weakness and light-headedness.  All other systems reviewed and are negative.   Per HPI unless specifically indicated above   Allergies as of 08/06/2022   No Known Allergies      Medication List        Accurate as of August 06, 2022  4:52 PM. If you have any questions, ask your nurse or doctor.          buPROPion 150 MG 24 hr tablet Commonly known as: WELLBUTRIN XL Take 150 mg by mouth daily.   FLUoxetine 20 MG tablet Commonly known as: PROZAC Take 20 mg by mouth daily.         Objective:   BP 133/82   Pulse 74   Temp 98.4 F (36.9 C)   Ht 5'  11" (1.803 m)   Wt 198 lb (89.8 kg)   SpO2 97%   BMI 27.62 kg/m   Wt Readings from Last 3 Encounters:  08/06/22 198 lb (89.8 kg)  10/03/21 208 lb (94.3 kg)  06/25/21 201 lb (91.2 kg)    Physical Exam Vitals and nursing note reviewed.  Constitutional:      General: He is not in acute distress.    Appearance: He is well-developed. He is not diaphoretic.  Eyes:     General: No scleral icterus.    Conjunctiva/sclera: Conjunctivae normal.  Neck:     Thyroid: No thyromegaly.  Cardiovascular:     Rate and Rhythm: Normal rate and regular rhythm.     Heart sounds: Normal heart sounds. No murmur heard. Pulmonary:     Effort: Pulmonary effort is normal. No respiratory distress.     Breath sounds: Normal breath sounds. No wheezing.  Abdominal:     General: Abdomen is flat. Bowel sounds are normal. There is no distension.     Tenderness: There is no abdominal tenderness. There is no guarding or rebound.  Musculoskeletal:        General: Normal range of motion.     Cervical back: Neck supple.  Lymphadenopathy:  Cervical: No cervical adenopathy.  Skin:    General: Skin is warm and dry.     Findings: No rash.  Neurological:     Mental Status: He is alert and oriented to person, place, and time.     Coordination: Coordination normal.  Psychiatric:        Behavior: Behavior normal.       Assessment & Plan:   Problem List Items Addressed This Visit   None Visit Diagnoses     Liver lesion    -  Primary   Relevant Orders   MR LIVER W WO CONTRAST   CBC with Differential/Platelet   CMP14+EGFR   Liver disease       Relevant Orders   MR LIVER W WO CONTRAST   CBC with Differential/Platelet   CMP14+EGFR      Repeat scan and f/u after about results  Follow up plan: Return if symptoms worsen or fail to improve.  Counseling provided for all of the vaccine components Orders Placed This Encounter  Procedures   MR LIVER W WO CONTRAST   CBC with Differential/Platelet    CMP14+EGFR    Caryl Pina, MD [facility] 08/06/2022, 4:52 PM

## 2022-08-07 LAB — CMP14+EGFR
ALT: 23 IU/L (ref 0–44)
AST: 17 IU/L (ref 0–40)
Albumin/Globulin Ratio: 2.7 — ABNORMAL HIGH (ref 1.2–2.2)
Albumin: 5.2 g/dL (ref 4.3–5.2)
Alkaline Phosphatase: 65 IU/L (ref 44–121)
BUN/Creatinine Ratio: 12 (ref 9–20)
BUN: 13 mg/dL (ref 6–20)
Bilirubin Total: 0.7 mg/dL (ref 0.0–1.2)
CO2: 27 mmol/L (ref 20–29)
Calcium: 10 mg/dL (ref 8.7–10.2)
Chloride: 102 mmol/L (ref 96–106)
Creatinine, Ser: 1.06 mg/dL (ref 0.76–1.27)
Globulin, Total: 1.9 g/dL (ref 1.5–4.5)
Glucose: 84 mg/dL (ref 70–99)
Potassium: 4.1 mmol/L (ref 3.5–5.2)
Sodium: 141 mmol/L (ref 134–144)
Total Protein: 7.1 g/dL (ref 6.0–8.5)
eGFR: 100 mL/min/{1.73_m2} (ref >59)

## 2022-08-07 LAB — CBC WITH DIFFERENTIAL/PLATELET
Basophils Absolute: 0.1 10*3/uL (ref 0.0–0.2)
Basos: 1 %
EOS (ABSOLUTE): 0.1 10*3/uL (ref 0.0–0.4)
Eos: 1 %
Hematocrit: 47.7 % (ref 37.5–51.0)
Hemoglobin: 16.3 g/dL (ref 13.0–17.7)
Immature Grans (Abs): 0 10*3/uL (ref 0.0–0.1)
Immature Granulocytes: 0 %
Lymphocytes Absolute: 2.8 10*3/uL (ref 0.7–3.1)
Lymphs: 30 %
MCH: 30 pg (ref 26.6–33.0)
MCHC: 34.2 g/dL (ref 31.5–35.7)
MCV: 88 fL (ref 79–97)
Monocytes Absolute: 0.8 10*3/uL (ref 0.1–0.9)
Monocytes: 8 %
Neutrophils Absolute: 5.6 10*3/uL (ref 1.4–7.0)
Neutrophils: 60 %
Platelets: 242 10*3/uL (ref 150–450)
RBC: 5.43 x10E6/uL (ref 4.14–5.80)
RDW: 13.1 % (ref 11.6–15.4)
WBC: 9.3 10*3/uL (ref 3.4–10.8)

## 2022-08-08 ENCOUNTER — Encounter: Payer: Self-pay | Admitting: Family Medicine

## 2022-08-27 ENCOUNTER — Ambulatory Visit (HOSPITAL_COMMUNITY)
Admission: RE | Admit: 2022-08-27 | Discharge: 2022-08-27 | Disposition: A | Discharge status: Home / Self Care | Payer: 59 | Source: Ambulatory Visit | Attending: Family Medicine | Admitting: Family Medicine

## 2022-08-27 DIAGNOSIS — K769 Liver disease, unspecified: Secondary | ICD-10-CM | POA: Insufficient documentation

## 2022-08-27 DIAGNOSIS — K7689 Other specified diseases of liver: Secondary | ICD-10-CM | POA: Diagnosis not present

## 2022-08-27 MED ORDER — GADOXETATE DISODIUM 0.25 MMOL/ML IV SOLN
10.0000 mL | Freq: Once | INTRAVENOUS | Status: AC | PRN
  Administered 2022-08-27: 10 mL via INTRAVENOUS

## 2022-09-18 DIAGNOSIS — H5213 Myopia, bilateral: Secondary | ICD-10-CM | POA: Diagnosis not present

## 2022-09-18 DIAGNOSIS — H52223 Regular astigmatism, bilateral: Secondary | ICD-10-CM | POA: Diagnosis not present

## 2023-01-12 DIAGNOSIS — F411 Generalized anxiety disorder: Secondary | ICD-10-CM | POA: Diagnosis not present

## 2023-01-12 DIAGNOSIS — F3342 Major depressive disorder, recurrent, in full remission: Secondary | ICD-10-CM | POA: Diagnosis not present

## 2023-01-19 ENCOUNTER — Other Ambulatory Visit (HOSPITAL_COMMUNITY): Payer: Self-pay

## 2023-01-19 MED ORDER — BUPROPION HCL ER (XL) 150 MG PO TB24
150.0000 mg | ORAL_TABLET | Freq: Every morning | ORAL | 0 refills | Status: DC
  Filled 2023-01-19: qty 30, 30d supply, fill #0

## 2023-01-19 MED ORDER — FLUOXETINE HCL 20 MG PO TABS
20.0000 mg | ORAL_TABLET | Freq: Every day | ORAL | 0 refills | Status: DC
  Filled 2023-01-19: qty 30, 30d supply, fill #0

## 2023-02-18 ENCOUNTER — Other Ambulatory Visit (HOSPITAL_COMMUNITY): Payer: Self-pay

## 2023-02-19 ENCOUNTER — Other Ambulatory Visit (HOSPITAL_COMMUNITY): Payer: Self-pay

## 2023-02-20 ENCOUNTER — Other Ambulatory Visit (HOSPITAL_COMMUNITY): Payer: Self-pay

## 2023-02-20 MED ORDER — FLUOXETINE HCL 20 MG PO TABS
20.0000 mg | ORAL_TABLET | Freq: Every day | ORAL | 2 refills | Status: DC
  Filled 2023-02-20: qty 90, 90d supply, fill #0
  Filled 2023-05-21 – 2023-05-22 (×2): qty 90, 90d supply, fill #1
  Filled 2023-08-25: qty 90, 90d supply, fill #2

## 2023-02-20 MED ORDER — BUPROPION HCL ER (XL) 150 MG PO TB24
150.0000 mg | ORAL_TABLET | Freq: Every morning | ORAL | 2 refills | Status: DC
  Filled 2023-02-20: qty 90, 90d supply, fill #0
  Filled 2023-05-21 – 2023-05-22 (×2): qty 90, 90d supply, fill #1
  Filled 2023-05-23 – 2023-08-25 (×2): qty 90, 90d supply, fill #2

## 2023-04-03 ENCOUNTER — Encounter: Payer: Self-pay | Admitting: Family Medicine

## 2023-04-03 DIAGNOSIS — Z0000 Encounter for general adult medical examination without abnormal findings: Secondary | ICD-10-CM

## 2023-04-09 ENCOUNTER — Other Ambulatory Visit: Payer: Commercial Managed Care - PPO

## 2023-04-09 DIAGNOSIS — Z0000 Encounter for general adult medical examination without abnormal findings: Secondary | ICD-10-CM | POA: Diagnosis not present

## 2023-04-09 LAB — LIPID PANEL
Chol/HDL Ratio: 5.1 ratio — ABNORMAL HIGH (ref 0.0–5.0)
Cholesterol, Total: 210 mg/dL — ABNORMAL HIGH (ref 100–199)
HDL: 41 mg/dL (ref >39)
LDL Chol Calc (NIH): 139 mg/dL — ABNORMAL HIGH (ref 0–99)
Triglycerides: 164 mg/dL — ABNORMAL HIGH (ref 0–149)
VLDL Cholesterol Cal: 30 mg/dL (ref 5–40)

## 2023-04-09 LAB — CMP14+EGFR
ALT: 20 IU/L (ref 0–44)
AST: 15 IU/L (ref 0–40)
Albumin: 4.5 g/dL (ref 4.3–5.2)
Alkaline Phosphatase: 56 IU/L (ref 44–121)
BUN/Creatinine Ratio: 15 (ref 9–20)
BUN: 17 mg/dL (ref 6–20)
Bilirubin Total: 0.4 mg/dL (ref 0.0–1.2)
CO2: 25 mmol/L (ref 20–29)
Calcium: 9.5 mg/dL (ref 8.7–10.2)
Chloride: 103 mmol/L (ref 96–106)
Creatinine, Ser: 1.1 mg/dL (ref 0.76–1.27)
Globulin, Total: 2.4 g/dL (ref 1.5–4.5)
Glucose: 70 mg/dL (ref 70–99)
Potassium: 4.2 mmol/L (ref 3.5–5.2)
Sodium: 141 mmol/L (ref 134–144)
Total Protein: 6.9 g/dL (ref 6.0–8.5)
eGFR: 96 mL/min/{1.73_m2} (ref >59)

## 2023-04-09 LAB — CBC WITH DIFFERENTIAL/PLATELET
Basophils Absolute: 0.1 10*3/uL (ref 0.0–0.2)
Basos: 1 %
EOS (ABSOLUTE): 0.1 10*3/uL (ref 0.0–0.4)
Eos: 2 %
Hematocrit: 46.4 % (ref 37.5–51.0)
Hemoglobin: 15.6 g/dL (ref 13.0–17.7)
Immature Grans (Abs): 0 10*3/uL (ref 0.0–0.1)
Immature Granulocytes: 0 %
Lymphocytes Absolute: 2.4 10*3/uL (ref 0.7–3.1)
Lymphs: 34 %
MCH: 29.6 pg (ref 26.6–33.0)
MCHC: 33.6 g/dL (ref 31.5–35.7)
MCV: 88 fL (ref 79–97)
Monocytes Absolute: 0.8 10*3/uL (ref 0.1–0.9)
Monocytes: 12 %
Neutrophils Absolute: 3.6 10*3/uL (ref 1.4–7.0)
Neutrophils: 51 %
Platelets: 219 10*3/uL (ref 150–450)
RBC: 5.27 x10E6/uL (ref 4.14–5.80)
RDW: 12.8 % (ref 11.6–15.4)
WBC: 7.1 10*3/uL (ref 3.4–10.8)

## 2023-05-22 ENCOUNTER — Other Ambulatory Visit: Payer: Self-pay

## 2023-05-22 ENCOUNTER — Other Ambulatory Visit (HOSPITAL_COMMUNITY): Payer: Self-pay

## 2023-05-23 ENCOUNTER — Other Ambulatory Visit (HOSPITAL_COMMUNITY): Payer: Self-pay

## 2023-05-26 ENCOUNTER — Other Ambulatory Visit: Payer: Self-pay

## 2023-06-04 ENCOUNTER — Encounter: Payer: BC Managed Care – PPO | Admitting: Family Medicine

## 2023-08-26 ENCOUNTER — Other Ambulatory Visit (HOSPITAL_COMMUNITY): Payer: Self-pay

## 2023-08-26 ENCOUNTER — Other Ambulatory Visit: Payer: Self-pay

## 2023-11-13 ENCOUNTER — Other Ambulatory Visit (HOSPITAL_COMMUNITY): Payer: Self-pay

## 2023-11-13 MED ORDER — BUPROPION HCL ER (XL) 150 MG PO TB24
150.0000 mg | ORAL_TABLET | Freq: Every morning | ORAL | 3 refills | Status: AC
  Filled 2023-11-13 – 2023-11-19 (×2): qty 90, 90d supply, fill #0
  Filled 2024-03-01 (×2): qty 90, 90d supply, fill #1
  Filled 2024-06-10: qty 90, 90d supply, fill #2
  Filled 2024-09-23: qty 90, 90d supply, fill #3

## 2023-11-13 MED ORDER — FLUOXETINE HCL 20 MG PO TABS
20.0000 mg | ORAL_TABLET | Freq: Every evening | ORAL | 3 refills | Status: AC
  Filled 2023-11-13 – 2023-11-19 (×2): qty 90, 90d supply, fill #0
  Filled 2024-03-01 (×2): qty 90, 90d supply, fill #1
  Filled 2024-06-10: qty 90, 90d supply, fill #2
  Filled 2024-09-23: qty 90, 90d supply, fill #3

## 2023-11-19 ENCOUNTER — Other Ambulatory Visit: Payer: Self-pay

## 2023-11-19 ENCOUNTER — Other Ambulatory Visit (HOSPITAL_COMMUNITY): Payer: Self-pay

## 2023-11-19 ENCOUNTER — Encounter: Payer: Self-pay | Admitting: Pharmacist

## 2024-03-01 ENCOUNTER — Other Ambulatory Visit (HOSPITAL_COMMUNITY): Payer: Self-pay

## 2024-03-01 ENCOUNTER — Other Ambulatory Visit: Payer: Self-pay

## 2024-03-03 ENCOUNTER — Other Ambulatory Visit (HOSPITAL_COMMUNITY): Payer: Self-pay

## 2024-06-13 ENCOUNTER — Other Ambulatory Visit: Payer: Self-pay

## 2024-06-13 ENCOUNTER — Other Ambulatory Visit (HOSPITAL_COMMUNITY): Payer: Self-pay

## 2024-09-26 ENCOUNTER — Other Ambulatory Visit: Payer: Self-pay

## 2024-09-26 ENCOUNTER — Other Ambulatory Visit (HOSPITAL_COMMUNITY): Payer: Self-pay
